# Patient Record
Sex: Female | Born: 2000 | Hispanic: No | Marital: Single | Smoking: Never smoker
Health system: Southern US, Community
[De-identification: ages and names within clinical notes are randomized; demographics above are authoritative.]

## PROBLEM LIST (undated history)

## (undated) DIAGNOSIS — A549 Gonococcal infection, unspecified: Secondary | ICD-10-CM

## (undated) DIAGNOSIS — F32A Depression, unspecified: Secondary | ICD-10-CM

## (undated) DIAGNOSIS — F419 Anxiety disorder, unspecified: Secondary | ICD-10-CM

## (undated) HISTORY — DX: Gonococcal infection, unspecified: A54.9

## (undated) HISTORY — DX: Depression, unspecified: F32.A

## (undated) HISTORY — DX: Anxiety disorder, unspecified: F41.9

---
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
## 2019-01-13 ENCOUNTER — Ambulatory Visit (HOSPITAL_COMMUNITY)
Admission: EM | Admit: 2019-01-13 | Discharge: 2019-01-13 | Disposition: A | Discharge status: Home / Self Care | Payer: Medicaid Other | Attending: Internal Medicine | Admitting: Internal Medicine

## 2019-01-13 ENCOUNTER — Encounter (HOSPITAL_COMMUNITY): Payer: Self-pay

## 2019-01-13 ENCOUNTER — Other Ambulatory Visit: Payer: Self-pay

## 2019-01-13 DIAGNOSIS — Z20828 Contact with and (suspected) exposure to other viral communicable diseases: Secondary | ICD-10-CM | POA: Insufficient documentation

## 2019-01-13 DIAGNOSIS — J020 Streptococcal pharyngitis: Secondary | ICD-10-CM

## 2019-01-13 DIAGNOSIS — R112 Nausea with vomiting, unspecified: Secondary | ICD-10-CM | POA: Insufficient documentation

## 2019-01-13 DIAGNOSIS — Z79899 Other long term (current) drug therapy: Secondary | ICD-10-CM | POA: Diagnosis not present

## 2019-01-13 DIAGNOSIS — J029 Acute pharyngitis, unspecified: Secondary | ICD-10-CM | POA: Diagnosis not present

## 2019-01-13 DIAGNOSIS — Z7722 Contact with and (suspected) exposure to environmental tobacco smoke (acute) (chronic): Secondary | ICD-10-CM | POA: Insufficient documentation

## 2019-01-13 LAB — POCT RAPID STREP A: Streptococcus, Group A Screen (Direct): POSITIVE — AB

## 2019-01-13 MED ORDER — CEPACOL EXTRA STRENGTH 15-2.6 MG MT LOZG: 1.0000 | LOZENGE | OROMUCOSAL | 0 refills | Status: DC | PRN

## 2019-01-13 MED ORDER — PENICILLIN V POTASSIUM 500 MG PO TABS: 500.0000 mg | ORAL_TABLET | Freq: Two times a day (BID) | ORAL | 0 refills | Status: AC

## 2019-01-13 NOTE — ED Provider Notes (Signed)
[facility]    CSN: [id number] Arrival date & time: 01/13/19  1558      History   Chief Complaint Chief Complaint  Patient presents with  . Nausea  . Emesis    HPI Rebecca Roy is a 18 y.o. female with a history of recurrent tonsillitis comes to urgent care with complaints of sore throat 3 days duration.  Patient says symptoms started fairly rapidly and has been persistent.  Is been associated with fever, difficulty swallowing, nausea and vomiting.  No shortness of breath, cough or sputum production.  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  No sick contacts.  No generalized body aches.  No loss of taste or smell. HPI  History reviewed. No pertinent past medical history.  Patient Active Problem List   Diagnosis Date Noted  . Contraception management 09/07/2016  . Vaginal bleeding 09/07/2016  . Induration of skin 09/07/2016  . Encounter for initial prescription of injectable contraceptive 11/22/2015  . Annual physical exam 08/31/2015    History reviewed. No pertinent surgical history.  OB History   No obstetric history on file.      Home Medications    Prior to Admission medications   Medication Sig Start Date End Date Taking? Authorizing Provider  acetaminophen (TYLENOL) 325 MG tablet Take 2 tablets (650 mg total) by mouth every 6 (six) hours as needed for mild pain, moderate pain or fever. 03/21/17   Jean Rosenthal, NP  cephALEXin (KEFLEX) 500 MG capsule Take 1 capsule (500 mg total) by mouth 3 (three) times daily. 07/16/18   Veryl Speak, MD  ibuprofen (ADVIL,MOTRIN) 400 MG tablet Take 1 tablet (400 mg total) by mouth every 6 (six) hours as needed. 12/07/17   Noemi Chapel, MD  norgestimate-ethinyl estradiol (ORTHO-CYCLEN,SPRINTEC,PREVIFEM) 0.25-35 MG-MCG tablet Take 1 tablet by mouth daily. 09/06/16   Rogue Bussing, MD  omeprazole (PRILOSEC) 20 MG capsule Take 1 capsule (20 mg total) by mouth daily. 06/15/17   Rancour,  Annie Main, MD  ondansetron (ZOFRAN ODT) 4 MG disintegrating tablet Take 1 tablet (4 mg total) by mouth every 8 (eight) hours as needed for nausea or vomiting. 06/15/17   Rancour, Annie Main, MD  potassium chloride (K-DUR) 10 MEQ tablet Take 1 tablet (10 mEq total) by mouth 2 (two) times daily. 06/15/17   Ezequiel Essex, MD    Family History Family History  Problem Relation Age of Onset  . Heart disease Maternal Aunt   . Diabetes Maternal Aunt   . Hypertension Maternal Aunt   . Heart disease Maternal Grandmother   . Diabetes Maternal Grandmother   . Hypertension Maternal Grandmother     Social History Social History   Tobacco Use  . Smoking status: Passive Smoke Exposure - Never Smoker  . Smokeless tobacco: Never Used  Substance Use Topics  . Alcohol use: No  . Drug use: Yes    Types: Marijuana     Allergies   Patient has no known allergies.   Review of Systems Review of Systems  Constitutional: Positive for activity change and fever. Negative for chills and fatigue.  HENT: Positive for sore throat. Negative for congestion, drooling, ear discharge, ear pain, mouth sores, postnasal drip, rhinorrhea, sinus pressure, sneezing and voice change.   Eyes: Negative for pain, redness and itching.  Respiratory: Negative.  Negative for chest tightness, shortness of breath and wheezing.   Cardiovascular: Negative for chest pain and palpitations.  Gastrointestinal: Positive for nausea and vomiting. Negative for abdominal pain, constipation  and diarrhea.  Genitourinary: Negative.  Negative for dysuria.  Musculoskeletal: Negative for arthralgias, back pain, gait problem and myalgias.  Skin: Negative.  Negative for rash.  Neurological: Negative for dizziness, weakness and headaches.  Psychiatric/Behavioral: Negative for confusion and decreased concentration.     Physical Exam Triage Vital Signs ED Triage Vitals  Enc Vitals Group     BP 01/13/19 1649 115/75     Pulse Rate 01/13/19  1649 80     Resp 01/13/19 1649 18     Temp 01/13/19 1649 98.4 F (36.9 C)     Temp Source 01/13/19 1649 Oral     SpO2 01/13/19 1649 100 %     Weight 01/13/19 1647 134 lb (60.8 kg)     Height --      Head Circumference --      Peak Flow --      Pain Score 01/13/19 1647 7     Pain Loc --      Pain Edu? --      Excl. in [place]? --    No data found.  Updated Vital Signs BP 115/75 (BP Location: Right Arm)   Pulse 80   Temp 98.4 F (36.9 C) (Oral)   Resp 18   Wt 60.8 kg   LMP 12/30/2018   SpO2 100%   BMI 23.74 kg/m   Visual Acuity Right Eye Distance:   Left Eye Distance:   Bilateral Distance:    Right Eye Near:   Left Eye Near:    Bilateral Near:     Physical Exam Vitals signs and nursing note reviewed.  Constitutional:      General: She is in acute distress.     Appearance: She is ill-appearing. She is not toxic-appearing.  HENT:     Right Ear: Tympanic membrane normal.     Left Ear: Tympanic membrane normal.     Nose: No rhinorrhea.     Mouth/Throat:     Pharynx: Posterior oropharyngeal erythema present.     Comments: Bilateral tonsillar enlargement with white patches.  Pharyngeal erythema.  Halitosis present. Neck:     Musculoskeletal: Normal range of motion. Muscular tenderness present. No neck rigidity.  Cardiovascular:     Rate and Rhythm: Normal rate and regular rhythm.     Pulses: Normal pulses.     Heart sounds: Normal heart sounds. No murmur. No friction rub.  Pulmonary:     Effort: Pulmonary effort is normal. No respiratory distress.     Breath sounds: Normal breath sounds. No wheezing or rhonchi.  Abdominal:     General: Bowel sounds are normal.     Palpations: Abdomen is soft.  Musculoskeletal: Normal range of motion.  Lymphadenopathy:     Cervical: Cervical adenopathy present.  Skin:    General: Skin is warm.     Capillary Refill: Capillary refill takes less than 2 seconds.  Neurological:     General: No focal deficit present.     Mental  Status: She is alert and oriented to person, place, and time.      UC Treatments / Results  Labs (all labs ordered are listed, but only abnormal results are displayed) Labs Reviewed  NOVEL CORONAVIRUS, NAA (HOSPITAL ORDER, SEND-OUT TO REF LAB)    EKG   Radiology No results found.  Procedures Procedures (including critical care time)  Medications Ordered in UC Medications - No data to display  Initial Impression / Assessment and Plan / UC Course  I have reviewed the triage vital  signs and the nursing notes.  Pertinent labs & imaging results that were available during my care of the patient were reviewed by me and considered in my medical decision making (see chart for details).     1.  Recurrent streptococcal pharyngitis: Rapid strep is positive Penicillin V 500 mg twice daily for 10 days Tylenol/Motrin for pain Cepacol lozenges as needed for throat pain Patient is advised to return to urgent care if symptoms worsen COVID-19 screening done Given the recurrent nature and persistent bilateral tonsillar hypertrophy in the setting of noisy breathing at night, patient will benefit from ENT evaluation for tonsillectomy.  Information on the ENT specialist has been given to the patient.  Final Clinical Impressions(s) / UC Diagnoses   Final diagnoses:  Acute pharyngitis, unspecified etiology   Discharge Instructions   None    ED Prescriptions    None     Controlled Substance Prescriptions [place] Controlled Substance Registry consulted? No   Chase Picket, MD 01/13/19 [phone]

## 2019-01-13 NOTE — ED Triage Notes (Signed)
Pt states she has had nausea, vomiting and fever. X 3 days. Pt states she has a sore throat as well.

## 2019-01-17 LAB — NOVEL CORONAVIRUS, NAA (HOSP ORDER, SEND-OUT TO REF LAB; TAT 18-24 HRS): SARS-CoV-2, NAA: NOT DETECTED

## 2019-04-21 ENCOUNTER — Emergency Department (HOSPITAL_COMMUNITY)
Admission: EM | Admit: 2019-04-21 | Discharge: 2019-04-21 | Discharge status: Against Medical Advice | Payer: Medicaid Other | Attending: Emergency Medicine | Admitting: Emergency Medicine

## 2019-04-21 ENCOUNTER — Encounter (HOSPITAL_COMMUNITY): Payer: Self-pay

## 2019-04-21 ENCOUNTER — Other Ambulatory Visit: Payer: Self-pay

## 2019-04-21 DIAGNOSIS — Z5321 Procedure and treatment not carried out due to patient leaving prior to being seen by health care provider: Secondary | ICD-10-CM | POA: Diagnosis not present

## 2019-04-21 DIAGNOSIS — J029 Acute pharyngitis, unspecified: Secondary | ICD-10-CM | POA: Diagnosis present

## 2019-04-21 NOTE — ED Triage Notes (Signed)
Patient complains of sore throat and painful swallowing x 2 days. Patient anxious on arrival, no trouble handling secretions

## 2019-04-21 NOTE — ED Notes (Signed)
Pt called for room x4 with non answer

## 2019-04-22 ENCOUNTER — Encounter (HOSPITAL_COMMUNITY): Payer: Self-pay | Admitting: Emergency Medicine

## 2019-04-22 ENCOUNTER — Other Ambulatory Visit: Payer: Self-pay

## 2019-04-22 ENCOUNTER — Emergency Department (HOSPITAL_COMMUNITY)
Admission: EM | Admit: 2019-04-22 | Discharge: 2019-04-22 | Disposition: A | Discharge status: Home / Self Care | Payer: Medicaid Other | Attending: Emergency Medicine | Admitting: Emergency Medicine

## 2019-04-22 ENCOUNTER — Emergency Department (HOSPITAL_COMMUNITY): Payer: Medicaid Other

## 2019-04-22 DIAGNOSIS — J029 Acute pharyngitis, unspecified: Secondary | ICD-10-CM | POA: Diagnosis present

## 2019-04-22 DIAGNOSIS — Z793 Long term (current) use of hormonal contraceptives: Secondary | ICD-10-CM | POA: Insufficient documentation

## 2019-04-22 DIAGNOSIS — J020 Streptococcal pharyngitis: Secondary | ICD-10-CM | POA: Diagnosis not present

## 2019-04-22 DIAGNOSIS — Z7722 Contact with and (suspected) exposure to environmental tobacco smoke (acute) (chronic): Secondary | ICD-10-CM | POA: Diagnosis not present

## 2019-04-22 DIAGNOSIS — Z79899 Other long term (current) drug therapy: Secondary | ICD-10-CM | POA: Insufficient documentation

## 2019-04-22 LAB — GROUP A STREP BY PCR: Group A Strep by PCR: DETECTED — AB

## 2019-04-22 MED ORDER — DEXAMETHASONE 10 MG/ML FOR PEDIATRIC ORAL USE
16.0000 mg | Freq: Once | INTRAMUSCULAR | Status: AC
  Administered 2019-04-22: 16 mg via ORAL
  Filled 2019-04-22: qty 2

## 2019-04-22 MED ORDER — LIDOCAINE HCL (PF) 1 % IJ SOLN
INTRAMUSCULAR | Status: AC
  Filled 2019-04-22: qty 2

## 2019-04-22 MED ORDER — LIDOCAINE VISCOUS HCL 2 % MT SOLN
15.0000 mL | Freq: Once | OROMUCOSAL | Status: AC
  Administered 2019-04-22: 15 mL via OROMUCOSAL
  Filled 2019-04-22: qty 15

## 2019-04-22 MED ORDER — PENICILLIN G BENZATHINE 1200000 UNIT/2ML IM SUSP
1.2000 10*6.[IU] | Freq: Once | INTRAMUSCULAR | Status: AC
  Administered 2019-04-22: 1.2 10*6.[IU] via INTRAMUSCULAR
  Filled 2019-04-22: qty 2

## 2019-04-22 MED ORDER — KETOROLAC TROMETHAMINE 60 MG/2ML IM SOLN
60.0000 mg | Freq: Once | INTRAMUSCULAR | Status: AC
  Administered 2019-04-22: 60 mg via INTRAMUSCULAR
  Filled 2019-04-22 (×2): qty 2

## 2019-04-22 NOTE — ED Triage Notes (Signed)
Pt c/o sore throat and painful swallowing x 3 days.

## 2019-04-22 NOTE — ED Provider Notes (Signed)
[facility] EMERGENCY DEPARTMENT Provider Note   CSN: [id number] Arrival date & time: 04/22/19  0304     History   Chief Complaint Chief Complaint  Patient presents with  . Sore Throat    HPI Rebecca Roy is a 18 y.o. female.      Sore Throat This is a new problem. The current episode started more than 2 days ago. The problem occurs constantly. The problem has been gradually worsening. Pertinent negatives include no chest pain, no headaches and no shortness of breath. Nothing aggravates the symptoms. Nothing relieves the symptoms. She has tried nothing for the symptoms.    History reviewed. No pertinent past medical history.  Patient Active Problem List   Diagnosis Date Noted  . Contraception management 09/07/2016  . Vaginal bleeding 09/07/2016  . Induration of skin 09/07/2016  . Encounter for initial prescription of injectable contraceptive 11/22/2015  . Annual physical exam 08/31/2015    History reviewed. No pertinent surgical history.   OB History   No obstetric history on file.      Home Medications    Prior to Admission medications   Medication Sig Start Date End Date Taking? Authorizing Provider  acetaminophen (TYLENOL) 325 MG tablet Take 2 tablets (650 mg total) by mouth every 6 (six) hours as needed for mild pain, moderate pain or fever. 03/21/17   Scoville, Kennis Carina, NP  Benzocaine-Menthol (CEPACOL EXTRA STRENGTH) 15-2.6 MG LOZG Use as directed 1 lozenge in the mouth or throat as needed. 01/13/19   Lamptey, Myrene Galas, MD  ibuprofen (ADVIL,MOTRIN) 400 MG tablet Take 1 tablet (400 mg total) by mouth every 6 (six) hours as needed. 12/07/17   Noemi Chapel, MD  norgestimate-ethinyl estradiol (ORTHO-CYCLEN,SPRINTEC,PREVIFEM) 0.25-35 MG-MCG tablet Take 1 tablet by mouth daily. 09/06/16   Rogue Bussing, MD  omeprazole (PRILOSEC) 20 MG capsule Take 1 capsule (20 mg total) by mouth daily. 06/15/17   Rancour, Annie Main, MD  ondansetron (ZOFRAN ODT) 4 MG  disintegrating tablet Take 1 tablet (4 mg total) by mouth every 8 (eight) hours as needed for nausea or vomiting. 06/15/17   Rancour, Annie Main, MD  potassium chloride (K-DUR) 10 MEQ tablet Take 1 tablet (10 mEq total) by mouth 2 (two) times daily. 06/15/17   Ezequiel Essex, MD    Family History Family History  Problem Relation Age of Onset  . Heart disease Maternal Aunt   . Diabetes Maternal Aunt   . Hypertension Maternal Aunt   . Heart disease Maternal Grandmother   . Diabetes Maternal Grandmother   . Hypertension Maternal Grandmother     Social History Social History   Tobacco Use  . Smoking status: Passive Smoke Exposure - Never Smoker  . Smokeless tobacco: Never Used  Substance Use Topics  . Alcohol use: No  . Drug use: Yes    Types: Marijuana     Allergies   Patient has no known allergies.   Review of Systems Review of Systems  Respiratory: Negative for shortness of breath.   Cardiovascular: Negative for chest pain.  Neurological: Negative for headaches.  All other systems reviewed and are negative.    Physical Exam Updated Vital Signs BP 124/71   Pulse 94   Temp 100 F (37.8 C)   Resp 18   Ht 5\' 3"  (1.6 m)   Wt 60.8 kg   LMP 04/17/2019   SpO2 99%   BMI 23.74 kg/m   Physical Exam Vitals signs and nursing note reviewed.  Constitutional:  Appearance: She is well-developed.  HENT:     Head: Normocephalic and atraumatic.     Mouth/Throat:     Pharynx: Oropharyngeal exudate and posterior oropharyngeal erythema present.  Eyes:     Pupils: Pupils are equal, round, and reactive to light.  Neck:     Musculoskeletal: Normal range of motion.  Cardiovascular:     Rate and Rhythm: Normal rate and regular rhythm.  Pulmonary:     Effort: No respiratory distress.     Breath sounds: No stridor.  Abdominal:     General: There is no distension.  Musculoskeletal: Normal range of motion.        General: No swelling or tenderness.  Skin:    General:  Skin is warm and dry.  Neurological:     General: No focal deficit present.     Mental Status: She is alert.      ED Treatments / Results  Labs (all labs ordered are listed, but only abnormal results are displayed) Labs Reviewed  GROUP A STREP BY PCR - Abnormal; Notable for the following components:      Result Value   Group A Strep by PCR DETECTED (*)    All other components within normal limits    EKG None  Radiology Dg Neck Soft Tissue  Result Date: 04/22/2019 CLINICAL DATA:  Sore throat for 3 days. EXAM: NECK SOFT TISSUES - 1+ VIEW COMPARISON:  None. FINDINGS: Palatine tonsillar thickening. No retropharyngeal thickening or gas. Normal epiglottis laryngeal shadows. IMPRESSION: 1. Tonsillar thickening. 2. Normal appearance of the epiglottis and retropharyngeal soft tissues. Electronically Signed   By: Monte Fantasia M.D.   On: 04/22/2019 04:14    Procedures Procedures (including critical care time)  Medications Ordered in ED Medications  ketorolac (TORADOL) injection 60 mg (60 mg Intramuscular Given 04/22/19 0411)  penicillin g benzathine (BICILLIN LA) 1200000 UNIT/2ML injection 1.2 Million Units (1.2 Million Units Intramuscular Given 04/22/19 0411)  dexamethasone (DECADRON) 10 MG/ML injection for Pediatric ORAL use 16 mg (16 mg Oral Given 04/22/19 0412)  lidocaine (XYLOCAINE) 2 % viscous mouth solution 15 mL (15 mLs Mouth/Throat Given 04/22/19 0411)     Initial Impression / Assessment and Plan / ED Course  I have reviewed the triage vital signs and the nursing notes.  Pertinent labs & imaging results that were available during my care of the patient were reviewed by me and considered in my medical decision making (see chart for details).        Uncomplicated strep pharyngitis. No e/o PTA or RPA. Swallowing secretions. No e/o deep neck space infection.  Final Clinical Impressions(s) / ED Diagnoses   Final diagnoses:  Pharyngitis, unspecified etiology    ED  Discharge Orders    None       Burnetta Kohls, Corene Cornea, MD 04/22/19 [phone]

## 2020-05-27 ENCOUNTER — Other Ambulatory Visit: Payer: Self-pay

## 2020-05-27 ENCOUNTER — Emergency Department (HOSPITAL_COMMUNITY)
Admission: EM | Admit: 2020-05-27 | Discharge: 2020-05-27 | Disposition: A | Discharge status: Home / Self Care | Payer: Medicaid Other | Attending: Emergency Medicine | Admitting: Emergency Medicine

## 2020-05-27 ENCOUNTER — Encounter (HOSPITAL_COMMUNITY): Payer: Self-pay | Admitting: *Deleted

## 2020-05-27 DIAGNOSIS — Z7722 Contact with and (suspected) exposure to environmental tobacco smoke (acute) (chronic): Secondary | ICD-10-CM | POA: Insufficient documentation

## 2020-05-27 DIAGNOSIS — B950 Streptococcus, group A, as the cause of diseases classified elsewhere: Secondary | ICD-10-CM

## 2020-05-27 DIAGNOSIS — J029 Acute pharyngitis, unspecified: Secondary | ICD-10-CM | POA: Diagnosis present

## 2020-05-27 DIAGNOSIS — J020 Streptococcal pharyngitis: Secondary | ICD-10-CM | POA: Insufficient documentation

## 2020-05-27 LAB — I-STAT BETA HCG BLOOD, ED (MC, WL, AP ONLY): I-stat hCG, quantitative: <5 m[IU]/mL (ref <5)

## 2020-05-27 LAB — GROUP A STREP BY PCR: Group A Strep by PCR: DETECTED — AB

## 2020-05-27 MED ORDER — LIDOCAINE VISCOUS HCL 2 % MT SOLN
15.0000 mL | Freq: Once | OROMUCOSAL | Status: AC
  Administered 2020-05-27: 15 mL via OROMUCOSAL
  Filled 2020-05-27: qty 15

## 2020-05-27 MED ORDER — KETOROLAC TROMETHAMINE 30 MG/ML IJ SOLN
30.0000 mg | Freq: Once | INTRAMUSCULAR | Status: AC
  Administered 2020-05-27: 30 mg via INTRAMUSCULAR
  Filled 2020-05-27: qty 1

## 2020-05-27 MED ORDER — AMOXICILLIN 500 MG PO CAPS: 500.0000 mg | ORAL_CAPSULE | Freq: Two times a day (BID) | ORAL | 0 refills | Status: AC

## 2020-05-27 MED ORDER — NAPROXEN 500 MG PO TABS: 500.0000 mg | ORAL_TABLET | Freq: Two times a day (BID) | ORAL | 0 refills | Status: DC

## 2020-05-27 MED ORDER — DEXAMETHASONE SODIUM PHOSPHATE 10 MG/ML IJ SOLN
10.0000 mg | Freq: Once | INTRAMUSCULAR | Status: AC
  Administered 2020-05-27: 10 mg via INTRAMUSCULAR
  Filled 2020-05-27: qty 1

## 2020-05-27 NOTE — ED Notes (Signed)
Patient called for room placement x1 with no answer. 

## 2020-05-27 NOTE — Discharge Instructions (Signed)
As discussed, your strep test was positive.  I am sending you home with an antibiotic.  Take as prescribed and finish all antibiotics.  Also sending home with some pain medication.  Take as needed for pain.  Please follow-up with PCP if symptoms do not improve within the next week.  Return to the ER for new or worsening symptoms.

## 2020-05-27 NOTE — ED Triage Notes (Signed)
Pt states she has swelling on the left side of throat. Has a history of tonsillitis. States it feels like it again, 3 rd time in last year, concerned if they need to be removed.

## 2020-05-27 NOTE — ED Provider Notes (Signed)
[facility] DEPT Provider Note   CSN: [id number] Arrival date & time: 05/27/20  1141     History No chief complaint on file.   Rebecca Roy is a 19 y.o. female with no significant past medical history who presents to the ED due to sore throat x2 days associated with left-sided edema, voice changes, and pain with swallowing.  Patient notes she had a fever last night.  T-max 102 F.  Sore throat associated with headache.  Patient denies nausea, vomiting, abdominal pain, difficulties breathing, cough.  Patient denies sick contacts and known Covid exposures.  Patient denies any recent oral intercourse.  No difficulty swallowing however, patient notes it is extremely painful.  No treatment prior to arrival.  History obtained from patient and past medical records. No interpreter used during encounter.      History reviewed. No pertinent past medical history.  Patient Active Problem List   Diagnosis Date Noted  . Contraception management 09/07/2016  . Vaginal bleeding 09/07/2016  . Induration of skin 09/07/2016  . Encounter for initial prescription of injectable contraceptive 11/22/2015  . Annual physical exam 08/31/2015    History reviewed. No pertinent surgical history.   OB History   No obstetric history on file.     Family History  Problem Relation Age of Onset  . Heart disease Maternal Aunt   . Diabetes Maternal Aunt   . Hypertension Maternal Aunt   . Heart disease Maternal Grandmother   . Diabetes Maternal Grandmother   . Hypertension Maternal Grandmother     Social History   Tobacco Use  . Smoking status: Passive Smoke Exposure - Never Smoker  . Smokeless tobacco: Never Used  Vaping Use  . Vaping Use: Never used  Substance Use Topics  . Alcohol use: No  . Drug use: Yes    Types: Marijuana    Home Medications Prior to Admission medications   Medication Sig Start Date End Date Taking? Authorizing Provider  acetaminophen  (TYLENOL) 325 MG tablet Take 2 tablets (650 mg total) by mouth every 6 (six) hours as needed for mild pain, moderate pain or fever. 03/21/17   Jean Rosenthal, NP  amoxicillin (AMOXIL) 500 MG capsule Take 1 capsule (500 mg total) by mouth 2 (two) times daily for 10 days. 05/27/20 06/06/20  Suzy Bouchard, PA-C  Benzocaine-Menthol (CEPACOL EXTRA STRENGTH) 15-2.6 MG LOZG Use as directed 1 lozenge in the mouth or throat as needed. 01/13/19   Lamptey, Myrene Galas, MD  ibuprofen (ADVIL,MOTRIN) 400 MG tablet Take 1 tablet (400 mg total) by mouth every 6 (six) hours as needed. 12/07/17   Noemi Chapel, MD  naproxen (NAPROSYN) 500 MG tablet Take 1 tablet (500 mg total) by mouth 2 (two) times daily. 05/27/20   Suzy Bouchard, PA-C  norgestimate-ethinyl estradiol (ORTHO-CYCLEN,SPRINTEC,PREVIFEM) 0.25-35 MG-MCG tablet Take 1 tablet by mouth daily. 09/06/16   Rogue Bussing, MD  omeprazole (PRILOSEC) 20 MG capsule Take 1 capsule (20 mg total) by mouth daily. 06/15/17   Rancour, Annie Main, MD  ondansetron (ZOFRAN ODT) 4 MG disintegrating tablet Take 1 tablet (4 mg total) by mouth every 8 (eight) hours as needed for nausea or vomiting. 06/15/17   Rancour, Annie Main, MD  potassium chloride (K-DUR) 10 MEQ tablet Take 1 tablet (10 mEq total) by mouth 2 (two) times daily. 06/15/17   Ezequiel Essex, MD    Allergies    Patient has no known allergies.  Review of Systems   Review of Systems  Constitutional: Positive  for chills and fever.  HENT: Positive for sore throat and voice change. Negative for ear pain and facial swelling.   Respiratory: Negative for cough and shortness of breath.   Cardiovascular: Negative for chest pain.    Physical Exam Updated Vital Signs BP 122/83 (BP Location: Right Arm)   Pulse 98   Temp 98.4 F (36.9 C) (Oral)   Resp 16   Ht 5\' 3"  (1.6 m)   SpO2 98%   BMI 23.74 kg/m   Physical Exam Vitals and nursing note reviewed.  Constitutional:      General: She is  not in acute distress.    Appearance: She is not toxic-appearing.  HENT:     Head: Normocephalic.     Mouth/Throat:     Comments: Significant erythema with 3+ tonsillar hypertrophy on left. No trismus. Uvula slightly deviated. Exudates present.  Tolerating oral secretions without difficulty.  Slightly muffled voice. Eyes:     Pupils: Pupils are equal, round, and reactive to light.  Neck:     Comments: No meningismus. Cardiovascular:     Rate and Rhythm: Normal rate and regular rhythm.     Pulses: Normal pulses.     Heart sounds: Normal heart sounds. No murmur heard. No friction rub. No gallop.   Pulmonary:     Effort: Pulmonary effort is normal.     Breath sounds: Normal breath sounds.  Abdominal:     General: Abdomen is flat. There is no distension.     Palpations: Abdomen is soft.     Tenderness: There is no abdominal tenderness. There is no guarding or rebound.  Musculoskeletal:     Cervical back: Neck supple.     Comments: Able to move all 4 extremities without difficulty.  Skin:    General: Skin is warm and dry.  Neurological:     General: No focal deficit present.     Mental Status: She is alert.  Psychiatric:        Mood and Affect: Mood normal.        Behavior: Behavior normal.     ED Results / Procedures / Treatments   Labs (all labs ordered are listed, but only abnormal results are displayed) Labs Reviewed  GROUP A STREP BY PCR - Abnormal; Notable for the following components:      Result Value   Group A Strep by PCR DETECTED (*)    All other components within normal limits  I-STAT BETA HCG BLOOD, ED ([facility], [facility], [facility] ONLY)    EKG None  Radiology No results found.  Procedures Procedures (including critical care time)  Medications Ordered in ED Medications  dexamethasone (DECADRON) injection 10 mg (10 mg Intramuscular Given 05/27/20 1404)  ketorolac (TORADOL) 30 MG/ML injection 30 mg (30 mg Intramuscular Given 05/27/20 1404)  lidocaine (XYLOCAINE) 2 %  viscous mouth solution 15 mL (15 mLs Mouth/Throat Given 05/27/20 1404)    ED Course  I have reviewed the triage vital signs and the nursing notes.  Pertinent labs & imaging results that were available during my care of the patient were reviewed by me and considered in my medical decision making (see chart for details).  Clinical Course as of 05/27/20 1531  Sat May 27, 2020  1252 I-stat hCG, quantitative: <5.0 [CA]  1524 Group A Strep by PCR(!): DETECTED [CA]    Clinical Course User Index [CA] Suzy Bouchard, PA-C   MDM Rules/Calculators/A&P  19 year old female presents to the ED due to sore throat x2 days associated with changes to phonation.  Upon arrival, vitals all within normal limits.  Low suspicion for sepsis at this time.  Patient in no acute distress and nontoxic-appearing.  Physical exam significant for significant erythema with 3+ tonsillar hypertrophy on left. No trismus. Uvula slightly deviated. Exudates present.  Tolerating oral secretions without difficulty.  Slightly muffled voice. No obvious abscess.  No meningismus to suggest meningitis.  Will obtain strep test and pregnancy test prior to giving medications.  Low suspicion for gonorrhea infection given no oral intercourse recently. Suspect symptoms related to tonsillitis. Low suspicion for abscess after discussion with Dr. Eulis Foster. Discussed case with Dr. Eulis Foster who evaluated patient at bedside and agrees with assessment and plan. Patient given Toradol, Decadron, and viscous lidocaine here in the ED for symptomatic relief.   Strep test positive.  Will discharge patient with antibiotics and pain medication.  Instructed patient to follow-up with PCP if symptoms not improved within the next week. Strict ED precautions discussed with patient. Patient states understanding and agrees to plan. Patient discharged home in no acute distress and stable vitals. Final Clinical Impression(s) / ED Diagnoses Final  diagnoses:  Group A streptococcal infection    Rx / DC Orders ED Discharge Orders         Ordered    amoxicillin (AMOXIL) 500 MG capsule  2 times daily        05/27/20 1416    naproxen (NAPROSYN) 500 MG tablet  2 times daily        05/27/20 1525           Karie Kirks 05/27/20 1531    Daleen Bo, MD 05/27/20 2020

## 2020-05-27 NOTE — ED Notes (Signed)
Patient has a extra lav and light green in main lab

## 2020-05-27 NOTE — ED Provider Notes (Signed)
  Face-to-face evaluation   History: She presents for evaluation of sore throat for 2 days, left greater than right.  She is concerned about recurrent tonsillitis.  She states she had trouble sleeping last night because it was difficult to breathe.  Physical exam: Alert, calm, cooperative.  No stridor.  No trismus.  Moderate tonsillar hypertrophy with exudate, left greater than right.  No evidence for peritonsillar abscess.  When the patient opens her mouth she moves her head to the right, which sometimes makes patients appear to have a peritonsillar abscess.  In this case I do not believe that she has a peritonsillar abscess.  Medical screening examination/treatment/procedure(s) were conducted as a shared visit with non-physician practitioner(s) and myself.  I personally evaluated the patient during the encounter   Daleen Bo, MD 05/27/20 2020

## 2020-11-23 ENCOUNTER — Encounter (HOSPITAL_COMMUNITY): Payer: Self-pay

## 2020-11-23 ENCOUNTER — Emergency Department (HOSPITAL_COMMUNITY): Payer: Medicaid Other

## 2020-11-23 ENCOUNTER — Emergency Department (HOSPITAL_COMMUNITY)
Admission: EM | Admit: 2020-11-23 | Discharge: 2020-11-23 | Disposition: A | Discharge status: Home / Self Care | Payer: Medicaid Other | Attending: Emergency Medicine | Admitting: Emergency Medicine

## 2020-11-23 ENCOUNTER — Other Ambulatory Visit: Payer: Self-pay

## 2020-11-23 DIAGNOSIS — Z5321 Procedure and treatment not carried out due to patient leaving prior to being seen by health care provider: Secondary | ICD-10-CM | POA: Insufficient documentation

## 2020-11-23 DIAGNOSIS — N6310 Unspecified lump in the right breast, unspecified quadrant: Secondary | ICD-10-CM | POA: Diagnosis not present

## 2020-11-23 DIAGNOSIS — R1084 Generalized abdominal pain: Secondary | ICD-10-CM | POA: Diagnosis not present

## 2020-11-23 LAB — I-STAT BETA HCG BLOOD, ED (MC, WL, AP ONLY): I-stat hCG, quantitative: <5 m[IU]/mL (ref <5)

## 2020-11-23 LAB — CBC WITH DIFFERENTIAL/PLATELET
Abs Immature Granulocytes: 0.01 10*3/uL (ref 0.00–0.07)
Basophils Absolute: 0 10*3/uL (ref 0.0–0.1)
Basophils Relative: 0 %
Eosinophils Absolute: 0.3 10*3/uL (ref 0.0–0.5)
Eosinophils Relative: 4 %
HCT: 35.5 % — ABNORMAL LOW (ref 36.0–46.0)
Hemoglobin: 11.2 g/dL — ABNORMAL LOW (ref 12.0–15.0)
Immature Granulocytes: 0 %
Lymphocytes Relative: 25 %
Lymphs Abs: 2.1 10*3/uL (ref 0.7–4.0)
MCH: 28.6 pg (ref 26.0–34.0)
MCHC: 31.5 g/dL (ref 30.0–36.0)
MCV: 90.8 fL (ref 80.0–100.0)
Monocytes Absolute: 1 10*3/uL (ref 0.1–1.0)
Monocytes Relative: 11 %
Neutro Abs: 5 10*3/uL (ref 1.7–7.7)
Neutrophils Relative %: 60 %
Platelets: 319 10*3/uL (ref 150–400)
RBC: 3.91 MIL/uL (ref 3.87–5.11)
RDW: 16 % — ABNORMAL HIGH (ref 11.5–15.5)
WBC: 8.4 10*3/uL (ref 4.0–10.5)
nRBC: 0 % (ref 0.0–0.2)

## 2020-11-23 LAB — COMPREHENSIVE METABOLIC PANEL
ALT: 11 U/L (ref 0–44)
AST: 14 U/L — ABNORMAL LOW (ref 15–41)
Albumin: 4.1 g/dL (ref 3.5–5.0)
Alkaline Phosphatase: 56 U/L (ref 38–126)
Anion gap: 7 (ref 5–15)
BUN: 13 mg/dL (ref 6–20)
CO2: 24 mmol/L (ref 22–32)
Calcium: 8.9 mg/dL (ref 8.9–10.3)
Chloride: 107 mmol/L (ref 98–111)
Creatinine, Ser: 0.8 mg/dL (ref 0.44–1.00)
GFR, Estimated: >60 mL/min (ref >60)
Glucose, Bld: 90 mg/dL (ref 70–99)
Potassium: 3.8 mmol/L (ref 3.5–5.1)
Sodium: 138 mmol/L (ref 135–145)
Total Bilirubin: 0.8 mg/dL (ref 0.3–1.2)
Total Protein: 7.2 g/dL (ref 6.5–8.1)

## 2020-11-23 LAB — URINALYSIS, ROUTINE W REFLEX MICROSCOPIC
Bilirubin Urine: NEGATIVE
Glucose, UA: NEGATIVE mg/dL
Hgb urine dipstick: NEGATIVE
Ketones, ur: 5 mg/dL — AB
Leukocytes,Ua: NEGATIVE
Nitrite: NEGATIVE
Protein, ur: NEGATIVE mg/dL
Specific Gravity, Urine: 1.027 (ref 1.005–1.030)
pH: 5 (ref 5.0–8.0)

## 2020-11-23 LAB — LIPASE, BLOOD: Lipase: 33 U/L (ref 11–51)

## 2020-11-23 NOTE — ED Provider Notes (Signed)
Emergency Medicine Provider Triage Evaluation Note  Rebecca Roy , a 20 y.o. female  was evaluated in triage.  Pt complains of abdominal pain.  She has four days of sharp lower abdominal pain.  Has nausea, constipation.  Lmp one week ago.  Also has sharp right sided chest pain intermittently for one year.  Has lump to right breast - two years ago.  Review of Systems  Positive: Nausea, abdominal pain, constipation Negative: Vomiting, fever, dysuria, vaginal discharge.   Physical Exam  BP 113/60 (BP Location: Left Arm)   Pulse 70   Temp (!) 97.5 F (36.4 C) (Oral)   Resp 16   Ht 5\' 3"  (1.6 m)   Wt 62.6 kg   LMP 10/15/2020 (Approximate)   SpO2 100%   BMI 24.45 kg/m  Gen:   Awake, no distress   Resp:  Normal effort  MSK:   Moves extremities without difficulty    Medical Decision Making  Medically screening exam initiated at 3:46 PM.  Appropriate orders placed.  Collyns Mcquigg was informed that the remainder of the evaluation will be completed by another provider, this initial triage assessment does not replace that evaluation, and the importance of remaining in the ED until their evaluation is complete.     Quintella Reichert, MD 11/23/20 [phone]

## 2020-11-23 NOTE — ED Triage Notes (Signed)
Patient c/o lower abdominal pain x 4 days. Patient denies vaginal discharge, but states she had spotting 3 days ago. Patient states she took a laxative 2 days ago to have a BM and has not had one since.  Patient denies any dysuria or urinary frequency  Patient also reports that she has lump on her right breast.

## 2020-11-23 NOTE — ED Notes (Signed)
Pt not in lobby.  

## 2020-11-24 ENCOUNTER — Emergency Department (HOSPITAL_COMMUNITY)
Admission: EM | Admit: 2020-11-24 | Discharge: 2020-11-24 | Disposition: A | Discharge status: Home / Self Care | Payer: Medicaid Other | Attending: Emergency Medicine | Admitting: Emergency Medicine

## 2020-11-24 ENCOUNTER — Emergency Department (HOSPITAL_COMMUNITY): Payer: Medicaid Other

## 2020-11-24 ENCOUNTER — Other Ambulatory Visit: Payer: Self-pay

## 2020-11-24 ENCOUNTER — Encounter (HOSPITAL_COMMUNITY): Payer: Self-pay

## 2020-11-24 DIAGNOSIS — K5900 Constipation, unspecified: Secondary | ICD-10-CM | POA: Insufficient documentation

## 2020-11-24 DIAGNOSIS — M545 Low back pain, unspecified: Secondary | ICD-10-CM | POA: Diagnosis not present

## 2020-11-24 DIAGNOSIS — N730 Acute parametritis and pelvic cellulitis: Secondary | ICD-10-CM | POA: Insufficient documentation

## 2020-11-24 DIAGNOSIS — R1030 Lower abdominal pain, unspecified: Secondary | ICD-10-CM | POA: Diagnosis present

## 2020-11-24 DIAGNOSIS — R110 Nausea: Secondary | ICD-10-CM | POA: Diagnosis not present

## 2020-11-24 LAB — WET PREP, GENITAL
Sperm: NONE SEEN
Trich, Wet Prep: NONE SEEN
Yeast Wet Prep HPF POC: NONE SEEN

## 2020-11-24 MED ORDER — FENTANYL CITRATE (PF) 100 MCG/2ML IJ SOLN: 50.0000 ug | Freq: Once | INTRAMUSCULAR | Status: DC

## 2020-11-24 MED ORDER — CEFTRIAXONE SODIUM 500 MG IJ SOLR
500.0000 mg | Freq: Once | INTRAMUSCULAR | Status: AC
  Administered 2020-11-24: 500 mg via INTRAMUSCULAR
  Filled 2020-11-24: qty 500

## 2020-11-24 MED ORDER — ONDANSETRON 4 MG PO TBDP
4.0000 mg | ORAL_TABLET | Freq: Once | ORAL | Status: AC
  Administered 2020-11-24: 4 mg via ORAL
  Filled 2020-11-24: qty 1

## 2020-11-24 MED ORDER — DOXYCYCLINE HYCLATE 100 MG PO CAPS: 100.0000 mg | ORAL_CAPSULE | Freq: Two times a day (BID) | ORAL | 0 refills | Status: DC

## 2020-11-24 MED ORDER — ONDANSETRON HCL 4 MG/2ML IJ SOLN: 4.0000 mg | Freq: Once | INTRAMUSCULAR | Status: DC

## 2020-11-24 MED ORDER — METRONIDAZOLE 500 MG PO TABS
500.0000 mg | ORAL_TABLET | Freq: Two times a day (BID) | ORAL | 0 refills | Status: DC
Start: 2020-11-24 — End: 2020-11-24

## 2020-11-24 MED ORDER — FENTANYL CITRATE (PF) 100 MCG/2ML IJ SOLN
50.0000 ug | Freq: Once | INTRAMUSCULAR | Status: AC
  Administered 2020-11-24: 50 ug via INTRAMUSCULAR
  Filled 2020-11-24: qty 2

## 2020-11-24 MED ORDER — METRONIDAZOLE 500 MG PO TABS: 500.0000 mg | ORAL_TABLET | Freq: Two times a day (BID) | ORAL | 0 refills | Status: DC

## 2020-11-24 MED ORDER — DOXYCYCLINE HYCLATE 100 MG PO TABS
100.0000 mg | ORAL_TABLET | Freq: Once | ORAL | Status: AC
  Administered 2020-11-24: 100 mg via ORAL
  Filled 2020-11-24: qty 1

## 2020-11-24 NOTE — ED Provider Notes (Signed)
7:30 AM-checkout from Dr. Wyvonnia Dusky to evaluate patient after ultrasound imaging.  He has started treatment for PID.  8:35 AM-update.  Wet prep with clue cells and white cells.  At this time the patient states that her pain is better.  Her abdomen is soft.  She has mild tenderness in the right lower abdomen/pelvic region.  There is no peritoneal signs, and no rebound tenderness.  8:45 AM-discussed with on-call gynecologist, Dr. Elonda Husky.  We discussed the ultrasound results which he states does not indicate TOA.  He recommends 14 days of doxycycline and follow-up in his office posttreatment for reevaluation and possible further imaging.  8:50 PM-findings and follow-up discussed with patient, she is agreeable.  Plan GYN follow-up in 2 weeks.     Daleen Bo, MD 11/24/20 [phone]

## 2020-11-24 NOTE — Discharge Instructions (Addendum)
You are being treated for pelvic inflammatory disease.  Take the antibiotics as prescribed until they are finished.  Your sexual partner should be treated as well.  Follow-up with the gynecologist.  Return to the ED with worsening pain, fever, vomiting, or other concerns.  Use Tylenol or Motrin for pain.

## 2020-11-24 NOTE — ED Triage Notes (Signed)
Patient c/o lower abdominal pain x 4 days. Patient denies vaginal discharge, but states she had spotting 3 days ago after having her normal period.  Patient states she took a laxative 2 days ago to have a BM and has not had one since.   Patient denies any dysuria or urinary frequency   Patient also reports that she has lump on her right breast.

## 2020-11-24 NOTE — ED Notes (Signed)
Pt VS updated and stable, medicated per MAR. Awaiting radiology. Bed locked and low. Call bell within reach. Will continue to monitor.

## 2020-11-24 NOTE — ED Provider Notes (Signed)
[facility] EMERGENCY DEPARTMENT Provider Note   CSN: [id number] Arrival date & time: 11/24/20  0335     History Chief Complaint  Patient presents with   Abdominal Pain    Rebecca Roy is a 20 y.o. female.  Patient presents with lower abdominal pain constant for the past 1 week.  She describes severe pain in the center of her abdomen at her spreads diffusely to her lower abdomen and low back.  Pain is constant, nothing makes it better nothing makes it worse.  Feels she is constipated but had a bowel movement 2 days ago after taking a laxative.  Nausea but no vomiting.  No pain with urination or blood in the urine.  No vaginal bleeding or discharge. Still has appendix and gallbladder.  She has never had this pain in the past.  Normally has a bowel movement every day but has been constipated over the past 1 week. No chest pain or shortness of breath.  No abdominal pain, no fever.  Has had a poor appetite as well.  No sick contacts or travel. She was seen at [facility] earlier but left after triage. hCG test was negative there  The history is provided by the patient.  Abdominal Pain Associated symptoms: constipation, fever and nausea   Associated symptoms: no cough, no dysuria, no hematuria, no shortness of breath, no vaginal bleeding, no vaginal discharge and no vomiting       No past medical history on file.  Patient Active Problem List   Diagnosis Date Noted   Contraception management 09/07/2016   Vaginal bleeding 09/07/2016   Induration of skin 09/07/2016   Encounter for initial prescription of injectable contraceptive 11/22/2015   Annual physical exam 08/31/2015    No past surgical history on file.   OB History   No obstetric history on file.     Family History  Problem Relation Age of Onset   Heart disease Maternal Grandmother    Diabetes Maternal Grandmother    Hypertension Maternal Grandmother    Heart disease Maternal Aunt    Diabetes Maternal Aunt     Hypertension Maternal Aunt     Social History   Tobacco Use   Smoking status: Never    Passive exposure: Yes   Smokeless tobacco: Never  Vaping Use   Vaping Use: Every day   Substances: Nicotine, Flavoring  Substance Use Topics   Alcohol use: No   Drug use: Yes    Types: Marijuana    Home Medications Prior to Admission medications   Medication Sig Start Date End Date Taking? Authorizing Provider  acetaminophen (TYLENOL) 325 MG tablet Take 2 tablets (650 mg total) by mouth every 6 (six) hours as needed for mild pain, moderate pain or fever. 03/21/17   Scoville, Kennis Carina, NP  Benzocaine-Menthol (CEPACOL EXTRA STRENGTH) 15-2.6 MG LOZG Use as directed 1 lozenge in the mouth or throat as needed. 01/13/19   Lamptey, Myrene Galas, MD  ibuprofen (ADVIL,MOTRIN) 400 MG tablet Take 1 tablet (400 mg total) by mouth every 6 (six) hours as needed. 12/07/17   Noemi Chapel, MD  naproxen (NAPROSYN) 500 MG tablet Take 1 tablet (500 mg total) by mouth 2 (two) times daily. 05/27/20   Suzy Bouchard, PA-C  norgestimate-ethinyl estradiol (ORTHO-CYCLEN,SPRINTEC,PREVIFEM) 0.25-35 MG-MCG tablet Take 1 tablet by mouth daily. 09/06/16   Rogue Bussing, MD  omeprazole (PRILOSEC) 20 MG capsule Take 1 capsule (20 mg total) by mouth daily. 06/15/17   Ezequiel Essex, MD  ondansetron (  ZOFRAN ODT) 4 MG disintegrating tablet Take 1 tablet (4 mg total) by mouth every 8 (eight) hours as needed for nausea or vomiting. 06/15/17   Hilario Robarts, Annie Main, MD  potassium chloride (K-DUR) 10 MEQ tablet Take 1 tablet (10 mEq total) by mouth 2 (two) times daily. 06/15/17   Ezequiel Essex, MD    Allergies    Patient has no known allergies.  Review of Systems   Review of Systems  Constitutional:  Positive for activity change, appetite change and fever.  HENT:  Negative for congestion and rhinorrhea.   Respiratory:  Negative for cough, chest tightness and shortness of breath.   Gastrointestinal:  Positive for  abdominal pain, constipation and nausea. Negative for vomiting.  Genitourinary:  Positive for flank pain. Negative for dysuria, hematuria, vaginal bleeding and vaginal discharge.  Musculoskeletal:  Positive for back pain. Negative for arthralgias and myalgias.  Neurological:  Negative for dizziness, weakness and headaches.   all other systems are negative except as noted in the HPI and PMH.   Physical Exam Updated Vital Signs BP 118/85 (BP Location: Left Arm)   Pulse 85   Temp 98.2 F (36.8 C) (Oral)   Resp 18   Ht 5\' 3"  (1.6 m)   Wt 62.6 kg   LMP 11/17/2020 (Approximate)   SpO2 100%   BMI 24.45 kg/m   Physical Exam Vitals and nursing note reviewed.  Constitutional:      General: She is not in acute distress.    Appearance: She is well-developed.  HENT:     Head: Normocephalic and atraumatic.     Mouth/Throat:     Pharynx: No oropharyngeal exudate.  Eyes:     Conjunctiva/sclera: Conjunctivae normal.     Pupils: Pupils are equal, round, and reactive to light.  Neck:     Comments: No meningismus. Cardiovascular:     Rate and Rhythm: Normal rate and regular rhythm.     Heart sounds: Normal heart sounds. No murmur heard. Pulmonary:     Effort: Pulmonary effort is normal. No respiratory distress.     Breath sounds: Normal breath sounds.  Abdominal:     Palpations: Abdomen is soft.     Tenderness: There is abdominal tenderness. There is guarding. There is no rebound.     Comments: Diffuse lower abdominal tenderness with voluntary guarding.  Worse in the midline.  Genitourinary:    Comments: Chaperone present Dominica NT.  Normal external genitalia.  White discharge in vaginal vault protruding from cervix.  Positive CMT.  Positive midline and right adnexal Tenderness.  Minimal left adnexal tenderness Musculoskeletal:        General: No tenderness. Normal range of motion.     Cervical back: Normal range of motion and neck supple.  Skin:    General: Skin is warm.   Neurological:     Mental Status: She is alert and oriented to person, place, and time.     Cranial Nerves: No cranial nerve deficit.     Motor: No abnormal muscle tone.     Coordination: Coordination normal.     Comments:  5/5 strength throughout. CN 2-12 intact.Equal grip strength.   Psychiatric:        Behavior: Behavior normal.    ED Results / Procedures / Treatments   Labs (all labs ordered are listed, but only abnormal results are displayed) Labs Reviewed  WET PREP, GENITAL - Abnormal; Notable for the following components:      Result Value   Clue Cells Wet Prep  HPF POC PRESENT (*)    WBC, Wet Prep HPF POC MANY (*)    All other components within normal limits  GC/CHLAMYDIA PROBE AMP ([place]) NOT AT [facility]    EKG None  Radiology CT ABDOMEN PELVIS WO CONTRAST  Result Date: 11/24/2020 CLINICAL DATA:  Right lower quadrant abdominal pain. EXAM: CT ABDOMEN AND PELVIS WITHOUT CONTRAST TECHNIQUE: Multidetector CT imaging of the abdomen and pelvis was performed following the standard protocol without IV contrast. COMPARISON:  Chest x-ray 11/23/2020 FINDINGS: Lower chest: No acute abnormality. Hepatobiliary: No focal liver abnormality. No gallstones, gallbladder wall thickening, or pericholecystic fluid. No biliary dilatation. Pancreas: No focal lesion. Normal pancreatic contour. No surrounding inflammatory changes. No main pancreatic ductal dilatation. Spleen: Normal in size without focal abnormality. Adrenals/Urinary Tract: No adrenal nodule bilaterally. No nephrolithiasis, no hydronephrosis, and no contour-deforming renal mass. No ureterolithiasis or hydroureter. The urinary bladder is unremarkable. Stomach/Bowel: Stomach is within normal limits. No evidence of bowel wall thickening or dilatation. The appendix is visualized but not well evaluated on this noncontrast study. The appendix appears to be grossly normal in caliber. The lumen of the appendix is gas filled with no definite  right lower quadrant inflammatory changes (5: 29-46). Vascular/Lymphatic: No significant vascular findings are present. No enlarged abdominal or pelvic lymph nodes. Reproductive: Uterus is unremarkable. Bilateral ovaries appear prominent in size. Otherwise bilateral adnexa are unremarkable. Other: Interval development of. No intraperitoneal free gas. No organized fluid collection. Musculoskeletal: No acute or significant osseous findings. IMPRESSION: 1. Prominent size of bilateral ovaries. Recommend pelvic ultrasound for further evaluation. 2. Trace free pelvic fluid. 3. The appendix is grossly unremarkable. Electronically Signed   By: Iven Finn M.D.   On: 11/24/2020 05:48   DG Chest 2 View  Result Date: 11/23/2020 CLINICAL DATA:  Enlarging lump under the right nipple with pain. EXAM: CHEST - 2 VIEW COMPARISON:  September 07, 2018 FINDINGS: The heart size and mediastinal contours are within normal limits. Both lungs are clear. Stable, mild to moderate severity dextroscoliosis of the thoracic spine is seen. IMPRESSION: Stable exam without active cardiopulmonary disease. Electronically Signed   By: Virgina Norfolk M.D.   On: 11/23/2020 16:50    Procedures Procedures   Medications Ordered in ED Medications - No data to display  ED Course  I have reviewed the triage vital signs and the nursing notes.  Pertinent labs & imaging results that were available during my care of the patient were reviewed by me and considered in my medical decision making (see chart for details).    MDM Rules/Calculators/A&P                         1 week of lower abdominal pain.  No urinary or vaginal symptoms.  Labs done at [facility] were normal earlier and showed negative hCG, negative urinalysis  Labs reviewed from [facility] which are normal.  Her hCG is negative and her urinalysis is negative.  CT scan is obtained to evaluate for appendix versus ovarian pathology.  Does have cervical motion tenderness on  exam.  Will treat for PID.  Ovaries appear abnormal on CT scan will obtain ultrasound to rule out tubo-ovarian abscess. Appendix appears non dilated.  Patient to be treated for PID with IM Rocephin, p.o. Flagyl and p.o. doxycycline.  Patient instructed to have her sexual partners treated as well. Follow-up with gynecology.  Care transferred at shift change with ultrasound pending.  Dr. Eulis Foster to assume care.  Final Clinical Impression(s) / ED Diagnoses Final diagnoses:  PID (acute pelvic inflammatory disease)    Rx / DC Orders ED Discharge Orders     None        Donyae Kilner, Annie Main, MD 11/24/20 [phone]

## 2020-11-26 LAB — GC/CHLAMYDIA PROBE AMP (~~LOC~~) NOT AT ARMC
Chlamydia: NEGATIVE
Comment: NEGATIVE
Comment: NORMAL
Neisseria Gonorrhea: POSITIVE — AB

## 2020-12-19 ENCOUNTER — Other Ambulatory Visit (HOSPITAL_COMMUNITY)
Admission: RE | Admit: 2020-12-19 | Discharge: 2020-12-19 | Disposition: A | Discharge status: Home / Self Care | Payer: Medicaid Other | Source: Ambulatory Visit | Attending: Adult Health | Admitting: Adult Health

## 2020-12-19 ENCOUNTER — Encounter: Payer: Self-pay | Admitting: Adult Health

## 2020-12-19 ENCOUNTER — Ambulatory Visit (INDEPENDENT_AMBULATORY_CARE_PROVIDER_SITE_OTHER): Payer: Medicaid Other | Admitting: Adult Health

## 2020-12-19 ENCOUNTER — Other Ambulatory Visit: Payer: Self-pay

## 2020-12-19 VITALS — BP 107/66 | HR 68 | Ht 63.0 in | Wt 121.5 lb

## 2020-12-19 DIAGNOSIS — Z8742 Personal history of other diseases of the female genital tract: Secondary | ICD-10-CM | POA: Insufficient documentation

## 2020-12-19 DIAGNOSIS — Z8619 Personal history of other infectious and parasitic diseases: Secondary | ICD-10-CM

## 2020-12-19 NOTE — Progress Notes (Signed)
  Subjective:     Patient ID: Rebecca Roy, female   DOB: [birth year], 20 y.o.   MRN: [id number]  HPI  Rebecca Roy is a 20 year old black female,single, G0P0, in for ER follow up was treated for PID and +GC 11/24/20, and she feels much better, has not had sex, since treatment.   Review of Systems Denies any pain, or discharge  Has not has sex since treatment  Reviewed past medical,surgical, social and family history. Reviewed medications and allergies.     Objective:   Physical Exam BP 107/66 (BP Location: Left Arm, Patient Position: Sitting, Cuff Size: Normal)   Pulse 68   Ht 5\' 3"  (1.6 m)   Wt 121 lb 8 oz (55.1 kg)   LMP 12/14/2020   BMI 21.52 kg/m  Skin warm and dry.Pelvic: external genitalia is normal in appearance no lesions, vagina:pink, no discharge,urethra has no lesions or masses noted, cervix:smooth,no CMT uterus: normal size, shape and contour, non tender, no masses felt, adnexa: no masses or tenderness noted. Bladder is non tender and no masses felt. CV swab obtained.   AA is 0 Fall risk is low Depression screen [facility] 2/9 12/19/2020 11/22/2015  Decreased Interest 0 0  Down, Depressed, Hopeless 0 0  PHQ - 2 Score 0 0  Altered sleeping 0 -  Tired, decreased energy 0 -  Change in appetite 1 -  Feeling bad or failure about yourself  0 -  Trouble concentrating 0 -  Moving slowly or fidgety/restless 0 -  Suicidal thoughts 0 -  PHQ-9 Score 1 -    GAD 7 : Generalized Anxiety Score 12/19/2020  Nervous, Anxious, on Edge 0  Control/stop worrying 0  Worry too much - different things 1  Trouble relaxing 0  Restless 0  Easily annoyed or irritable 1  Afraid - awful might happen 0  Total GAD 7 Score 2   .  Upstream - 12/19/20 1513       Pregnancy Intention Screening   Does the patient want to become pregnant in the next year? No    Does the patient's partner want to become pregnant in the next year? No    Would the patient like to discuss contraceptive options today? No       Contraception Wrap Up   Current Method Female Condom    End Method Female Condom    Contraception Counseling Provided No            Examination chaperoned by Levy Pupa LPN  Assessment:     1. History of gonorrhea CV swab sent for GC/CHL,trich and BV and yeast   2. History of PID CV swab sent     Plan:     Return in October for pap and physical

## 2020-12-21 LAB — CERVICOVAGINAL ANCILLARY ONLY
Bacterial Vaginitis (gardnerella): NEGATIVE
Candida Glabrata: NEGATIVE
Candida Vaginitis: NEGATIVE
Chlamydia: NEGATIVE
Comment: NEGATIVE
Comment: NEGATIVE
Comment: NEGATIVE
Comment: NEGATIVE
Comment: NEGATIVE
Comment: NORMAL
Neisseria Gonorrhea: NEGATIVE
Trichomonas: NEGATIVE

## 2021-03-05 ENCOUNTER — Emergency Department (HOSPITAL_COMMUNITY)
Admission: EM | Admit: 2021-03-05 | Discharge: 2021-03-05 | Discharge status: Against Medical Advice | Payer: Medicaid Other

## 2021-03-05 NOTE — ED Notes (Signed)
Pt called for triage, no answer

## 2021-03-06 ENCOUNTER — Emergency Department (HOSPITAL_COMMUNITY)
Admission: EM | Admit: 2021-03-06 | Discharge: 2021-03-06 | Disposition: A | Discharge status: Home / Self Care | Payer: Medicaid Other | Attending: Emergency Medicine | Admitting: Emergency Medicine

## 2021-03-06 ENCOUNTER — Encounter (HOSPITAL_COMMUNITY): Payer: Self-pay | Admitting: Emergency Medicine

## 2021-03-06 DIAGNOSIS — L0231 Cutaneous abscess of buttock: Secondary | ICD-10-CM | POA: Insufficient documentation

## 2021-03-06 DIAGNOSIS — R2242 Localized swelling, mass and lump, left lower limb: Secondary | ICD-10-CM | POA: Diagnosis present

## 2021-03-06 MED ORDER — DOXYCYCLINE HYCLATE 100 MG PO CAPS: 100.0000 mg | ORAL_CAPSULE | Freq: Two times a day (BID) | ORAL | 0 refills | Status: DC

## 2021-03-06 MED ORDER — DIAZEPAM 5 MG PO TABS
5.0000 mg | ORAL_TABLET | Freq: Once | ORAL | Status: AC
  Administered 2021-03-06: 5 mg via ORAL
  Filled 2021-03-06: qty 1

## 2021-03-06 MED ORDER — ACETAMINOPHEN 500 MG PO TABS
1000.0000 mg | ORAL_TABLET | Freq: Once | ORAL | Status: AC
  Administered 2021-03-06: 1000 mg via ORAL
  Filled 2021-03-06: qty 2

## 2021-03-06 MED ORDER — OXYCODONE HCL 5 MG PO TABS
5.0000 mg | ORAL_TABLET | Freq: Once | ORAL | Status: AC
  Administered 2021-03-06: 5 mg via ORAL
  Filled 2021-03-06: qty 1

## 2021-03-06 MED ORDER — LIDOCAINE-EPINEPHRINE (PF) 2 %-1:200000 IJ SOLN
10.0000 mL | Freq: Once | INTRAMUSCULAR | Status: AC
  Administered 2021-03-06: 10 mL via INTRADERMAL
  Filled 2021-03-06: qty 20

## 2021-03-06 NOTE — ED Provider Notes (Signed)
..  Incision and Drainage  Date/Time: 03/06/2021 12:57 PM Performed by: Jacqlyn Larsen, PA-C Authorized by: Jacqlyn Larsen, PA-C   Consent:    Consent obtained:  Verbal   Consent given by:  Patient   Risks discussed:  Bleeding   Alternatives discussed:  No treatment Universal protocol:    Procedure explained and questions answered to patient or proxy's satisfaction: yes     Patient identity confirmed:  Verbally with patient Location:    Type:  Pilonidal cyst   Location:  Anogenital   Anogenital location:  Pilonidal Pre-procedure details:    Skin preparation:  Antiseptic wash Sedation:    Sedation type:  Anxiolysis Anesthesia:    Anesthesia method:  Local infiltration   Local anesthetic:  Lidocaine 2% WITH epi Procedure type:    Complexity:  Complex Procedure details:    Incision types:  Single straight   Incision depth:  Dermal   Wound management:  Probed and deloculated   Drainage:  Bloody and purulent   Drainage amount:  Copious   Wound treatment:  Wound left open   Packing materials:  1/4 in iodoform gauze Post-procedure details:    Procedure completion:  Tolerated well, no immediate complications    Jacqlyn Larsen, PA-C 03/06/21 [place], DO 03/06/21 1309

## 2021-03-06 NOTE — ED Triage Notes (Signed)
Pt states that she has had an abscess on her buttocks above her gluteal cleft x 3 days. Alert and oriented.

## 2021-03-06 NOTE — ED Provider Notes (Signed)
[facility] DEPT Provider Note   CSN: [id number] Arrival date & time: 03/06/21  0915     History Chief Complaint  Patient presents with   Abscess    Rebecca Roy is a 20 y.o. female.  20 yo F with a chief complaints of swelling to her left buttock.'s been going on for about 3 days.  She had a family member told her she had a fever yesterday but not having sensation of fevers or chills.  She denies any drainage to the area.  Denies any trauma to the skin.  The history is provided by the patient and a significant other.  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 No pertinent surgical history.   OB History     Gravida  0   Para  0   Term  0   Preterm  0   AB  0   Living  0      SAB  0   IAB  0   Ectopic  0   Multiple  0   Live Births  0           Family History  Problem Relation Age of Onset   Heart disease Maternal Grandmother    Diabetes Maternal Grandmother    Hypertension Maternal Grandmother    Other Maternal Grandfather        blood clot in lung   Heart disease Maternal Aunt    Diabetes Maternal Aunt    Hypertension Maternal Aunt     Social History   Tobacco Use   Smoking status: Never    Passive exposure: Yes   Smokeless tobacco: Never   Vaping Use   Vaping Use: Every day   Substances: Nicotine, Flavoring  Substance Use Topics   Alcohol use: No   Drug use: Yes    Types: Marijuana    Comment: everyday    Home Medications Prior to Admission medications   Medication Sig Start Date End Date Taking? Authorizing Provider  doxycycline (VIBRAMYCIN) 100 MG capsule Take 1 capsule (100 mg total) by mouth 2 (two) times daily. One po bid x 7 days 03/06/21  Yes Deno Etienne, DO  acetaminophen (TYLENOL) 325 MG tablet Take 2 tablets (650 mg total) by mouth every 6 (six) hours as needed for mild pain, moderate pain or fever. 03/21/17   Jean Rosenthal, NP    Allergies    Patient has no known allergies.  Review of Systems   Review of Systems  Constitutional:  Negative for chills and fever.  HENT:  Negative for congestion and rhinorrhea.   Eyes:  Negative for redness and visual disturbance.  Respiratory:  Negative for shortness of breath and wheezing.   Cardiovascular:  Negative for chest pain and palpitations.  Gastrointestinal:  Negative for nausea  and vomiting.  Genitourinary:  Negative for dysuria and urgency.  Musculoskeletal:  Negative for arthralgias and myalgias.  Skin:  Positive for color change. Negative for pallor and wound.  Neurological:  Negative for dizziness and headaches.   Physical Exam Updated Vital Signs BP 104/67 (BP Location: Right Arm)   Pulse 75   Temp 98.6 F (37 C) (Oral)   Resp 16   Ht 5\' 3"  (1.6 m)   Wt 58.5 kg   LMP 03/02/2021 (Approximate)   SpO2 100%   BMI 22.85 kg/m   Physical Exam Vitals and nursing note reviewed.  Constitutional:      General: She is not in acute distress.    Appearance: She is well-developed. She is not diaphoretic.  HENT:     Head: Normocephalic and atraumatic.  Eyes:     Pupils: Pupils are equal, round, and reactive to light.  Cardiovascular:     Rate and Rhythm: Normal rate and regular rhythm.     Heart sounds: No murmur heard.   No friction rub. No  gallop.  Pulmonary:     Effort: Pulmonary effort is normal.     Breath sounds: No wheezing or rales.  Abdominal:     General: There is no distension.     Palpations: Abdomen is soft.     Tenderness: There is no abdominal tenderness.  Genitourinary:      Comments: Are of erythema and swelling Musculoskeletal:        General: No tenderness.     Cervical back: Normal range of motion and neck supple.  Skin:    General: Skin is warm and dry.  Neurological:     Mental Status: She is alert and oriented to person, place, and time.  Psychiatric:        Behavior: Behavior normal.    ED Results / Procedures / Treatments   Labs (all labs ordered are listed, but only abnormal results are displayed) Labs Reviewed - No data to display  EKG None  Radiology No results found.  Procedures Procedures   Medications Ordered in ED Medications  lidocaine-EPINEPHrine (XYLOCAINE W/EPI) 2 %-1:200000 (PF) injection 10 mL (10 mLs Intradermal Given by Other 03/06/21 1233)  acetaminophen (TYLENOL) tablet 1,000 mg (1,000 mg Oral Given 03/06/21 1221)  oxyCODONE (Oxy IR/ROXICODONE) immediate release tablet 5 mg (5 mg Oral Given 03/06/21 1222)  diazepam (VALIUM) tablet 5 mg (5 mg Oral Given 03/06/21 1222)    ED Course  I have reviewed the triage vital signs and the nursing notes.  Pertinent labs & imaging results that were available during my care of the patient were reviewed by me and considered in my medical decision making (see chart for details).    MDM Rules/Calculators/A&P                           21 yo F with a chief complaints of left buttock swelling.  Clinically the patient has an abscess.  Will attempt I&D at bedside.  I&D successful, seem to track to the pilonidal region.  Will start on antibiotics and have her follow-up with general surgery in the office.  12:59 PM:  I have discussed the diagnosis/risks/treatment options with the patient and family and believe the pt to be eligible  for discharge home to follow-up with Gen surgery. We also discussed returning to the ED immediately if new or worsening sx occur. We discussed the sx which are most concerning (e.g., sudden worsening pain,  fever, inability to tolerate by mouth, rapid spreading redness) that necessitate immediate return. Medications administered to the patient during their visit and any new prescriptions provided to the patient are listed below.  Medications given during this visit Medications  lidocaine-EPINEPHrine (XYLOCAINE W/EPI) 2 %-1:200000 (PF) injection 10 mL (10 mLs Intradermal Given by Other 03/06/21 1233)  acetaminophen (TYLENOL) tablet 1,000 mg (1,000 mg Oral Given 03/06/21 1221)  oxyCODONE (Oxy IR/ROXICODONE) immediate release tablet 5 mg (5 mg Oral Given 03/06/21 1222)  diazepam (VALIUM) tablet 5 mg (5 mg Oral Given 03/06/21 1222)     The patient appears reasonably screen and/or stabilized for discharge and I doubt any other medical condition or other [facility] requiring further screening, evaluation, or treatment in the ED at this time prior to discharge.   Final Clinical Impression(s) / ED Diagnoses Final diagnoses:  Abscess of buttock, left    Rx / DC Orders ED Discharge Orders          Ordered    doxycycline (VIBRAMYCIN) 100 MG capsule  2 times daily        03/06/21 [place], Naylea Wigington, DO 03/06/21 1259

## 2021-03-06 NOTE — Discharge Instructions (Addendum)
Warm compression at least 4 times a day.  Follow-up in 48 hours to have your packing removed and reevaluated.  Please contact general surgery as they may need to do a surgical procedure to keep this from returning.  Take the antibiotics as prescribed.  Please return for rapid spreading redness or if you develop a fever.

## 2021-04-12 ENCOUNTER — Other Ambulatory Visit: Payer: Medicaid Other | Admitting: Adult Health

## 2021-05-08 ENCOUNTER — Other Ambulatory Visit: Payer: Medicaid Other | Admitting: Adult Health

## 2021-05-09 ENCOUNTER — Other Ambulatory Visit: Payer: Medicaid Other | Admitting: Advanced Practice Midwife

## 2021-06-11 ENCOUNTER — Encounter (HOSPITAL_COMMUNITY): Payer: Self-pay

## 2021-06-11 ENCOUNTER — Emergency Department (HOSPITAL_COMMUNITY)
Admission: EM | Admit: 2021-06-11 | Discharge: 2021-06-11 | Disposition: A | Discharge status: Home / Self Care | Payer: Medicaid Other | Attending: Emergency Medicine | Admitting: Emergency Medicine

## 2021-06-11 DIAGNOSIS — N760 Acute vaginitis: Secondary | ICD-10-CM | POA: Insufficient documentation

## 2021-06-11 DIAGNOSIS — Z7722 Contact with and (suspected) exposure to environmental tobacco smoke (acute) (chronic): Secondary | ICD-10-CM | POA: Insufficient documentation

## 2021-06-11 DIAGNOSIS — N739 Female pelvic inflammatory disease, unspecified: Secondary | ICD-10-CM | POA: Diagnosis not present

## 2021-06-11 DIAGNOSIS — B9689 Other specified bacterial agents as the cause of diseases classified elsewhere: Secondary | ICD-10-CM

## 2021-06-11 DIAGNOSIS — R109 Unspecified abdominal pain: Secondary | ICD-10-CM | POA: Diagnosis present

## 2021-06-11 LAB — URINALYSIS, ROUTINE W REFLEX MICROSCOPIC
Bilirubin Urine: NEGATIVE
Glucose, UA: NEGATIVE mg/dL
Hgb urine dipstick: NEGATIVE
Ketones, ur: 40 mg/dL — AB
Nitrite: NEGATIVE
Protein, ur: NEGATIVE mg/dL
Specific Gravity, Urine: 1.02 (ref 1.005–1.030)
pH: 8 (ref 5.0–8.0)

## 2021-06-11 LAB — WET PREP, GENITAL
Sperm: NONE SEEN
Trich, Wet Prep: NONE SEEN
WBC, Wet Prep HPF POC: >=10 — AB (ref <10)
Yeast Wet Prep HPF POC: NONE SEEN

## 2021-06-11 LAB — POC URINE PREG, ED: Preg Test, Ur: NEGATIVE

## 2021-06-11 LAB — URINALYSIS, MICROSCOPIC (REFLEX)

## 2021-06-11 LAB — PREGNANCY, URINE: Preg Test, Ur: NEGATIVE

## 2021-06-11 MED ORDER — LIDOCAINE HCL (PF) 1 % IJ SOLN
1.0000 mL | Freq: Once | INTRAMUSCULAR | Status: AC
  Administered 2021-06-11: 23:00:00 1 mL
  Filled 2021-06-11: qty 30

## 2021-06-11 MED ORDER — CEFTRIAXONE SODIUM 500 MG IJ SOLR
500.0000 mg | Freq: Once | INTRAMUSCULAR | Status: AC
  Administered 2021-06-11: 23:00:00 500 mg via INTRAMUSCULAR
  Filled 2021-06-11: qty 500

## 2021-06-11 MED ORDER — DOXYCYCLINE HYCLATE 100 MG PO CAPS: 100.0000 mg | ORAL_CAPSULE | Freq: Two times a day (BID) | ORAL | 0 refills | Status: DC

## 2021-06-11 MED ORDER — METRONIDAZOLE 500 MG PO TABS: 500.0000 mg | ORAL_TABLET | Freq: Two times a day (BID) | ORAL | 0 refills | Status: DC

## 2021-06-11 NOTE — ED Triage Notes (Signed)
Pt to ED c/o abdominal pain and vaginal discharge over past 3 days, new bump on genital area. New sexual partner, concern for STD, Pt reports could be pregnant.

## 2021-06-12 LAB — RPR: RPR Ser Ql: NONREACTIVE

## 2021-06-12 LAB — GC/CHLAMYDIA PROBE AMP (~~LOC~~) NOT AT ARMC
Chlamydia: NEGATIVE
Comment: NEGATIVE
Comment: NORMAL
Neisseria Gonorrhea: NEGATIVE

## 2021-06-12 LAB — HIV ANTIBODY (ROUTINE TESTING W REFLEX): HIV Screen 4th Generation wRfx: NONREACTIVE

## 2021-06-13 NOTE — ED Provider Notes (Signed)
[facility] EMERGENCY DEPARTMENT Provider Note   CSN: [id number] Arrival date & time: 06/11/21  1423     History Chief Complaint  Patient presents with   Exposure to STD   Abdominal Pain    Rebecca Roy is a 20 y.o. female.   Exposure to STD Associated symptoms include abdominal pain. Pertinent negatives include no shortness of breath.  Abdominal Pain Associated symptoms: vaginal discharge   Associated symptoms: no dysuria and no shortness of breath   Patient presents with pelvic pain.  Vaginal discharge.  Is had over the last 3 days.  Worried she could have another STD.  States posteriorly there is a swelling.  Does have a new sexual partner.  Is wondering if she could be pregnant.  Pain is in her lower abdomen.  No fevers or chills.  No coughing.  No dysuria.    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   OB History     Gravida  0   Para  0   Term  0   Preterm  0   AB  0   Living  0      SAB  0   IAB  0   Ectopic  0   Multiple  0   Live Births  0           Family History  Problem Relation Age of Onset   Heart disease Maternal Grandmother    Diabetes Maternal Grandmother    Hypertension Maternal Grandmother    Other Maternal Grandfather        blood clot in lung   Heart disease Maternal Aunt    Diabetes Maternal Aunt    Hypertension Maternal Aunt     Social History   Tobacco Use   Smoking status: Never    Passive exposure: Yes   Smokeless tobacco: Never  Vaping Use   Vaping Use: Every day   Substances: Nicotine, Flavoring  Substance Use Topics    Alcohol use: No   Drug use: Yes    Types: Marijuana    Comment: everyday    Home Medications Prior to Admission medications   Medication Sig Start Date End Date Taking? Authorizing Provider  metroNIDAZOLE (FLAGYL) 500 MG tablet Take 1 tablet (500 mg total) by mouth 2 (two) times daily. 06/11/21  Yes Davonna Belling, MD  acetaminophen (TYLENOL) 325 MG tablet Take 2 tablets (650 mg total) by mouth every 6 (six) hours as needed for mild pain, moderate pain or fever. 03/21/17   Jean Rosenthal, NP  doxycycline (VIBRAMYCIN) 100 MG capsule Take 1 capsule (100 mg total) by mouth 2 (two) times daily. One po bid x 7 days 06/11/21   Davonna Belling, MD    Allergies    Patient has no known allergies.  Review of Systems   Review of Systems  Constitutional:  Negative for appetite change.  Respiratory:  Negative for shortness of breath.   Cardiovascular:  Negative for leg swelling.  Gastrointestinal:  Positive for abdominal pain.  Genitourinary:  Positive for pelvic pain and vaginal discharge. Negative for dysuria and urgency.  Musculoskeletal:  Negative for back pain.  Skin:  Negative for rash.  Neurological:  Negative for weakness.   Physical Exam Updated Vital Signs BP 125/85    Pulse 80    Temp 98 F (36.7 C) (Oral)    Resp 18    Ht 5\' 3"  (1.6 m)    Wt 59 kg    LMP 05/22/2021    SpO2 100%    BMI 23.03 kg/m   Physical Exam Vitals and nursing note reviewed.  HENT:     Head: Atraumatic.  Abdominal:     Tenderness: There is abdominal tenderness.     Comments: Lower abdominal/suprapubic tenderness.  No rebound or guarding with no hernia palpated.  Genitourinary:    Comments: Some white vaginal discharge.  No cervical motion tenderness without specific adnexal tenderness. Skin:    General: Skin is warm.  Neurological:     Mental Status: She is alert.  Patient with mild perineal erythema posteriorly.  No ulcers.  May have small abrasion.  ED Results / Procedures / Treatments    Labs (all labs ordered are listed, but only abnormal results are displayed) Labs Reviewed  WET PREP, GENITAL - Abnormal; Notable for the following components:      Result Value   Clue Cells Wet Prep HPF POC PRESENT (*)    WBC, Wet Prep HPF POC >=10 (*)    All other components within normal limits  URINALYSIS, ROUTINE W REFLEX MICROSCOPIC - Abnormal; Notable for the following components:   Ketones, ur 40 (*)    Leukocytes,Ua TRACE (*)    All other components within normal limits  URINALYSIS, MICROSCOPIC (REFLEX) - Abnormal; Notable for the following components:   Bacteria, UA MANY (*)    All other components within normal limits  PREGNANCY, URINE  RPR  HIV ANTIBODY (ROUTINE TESTING W REFLEX)  POC URINE PREG, ED  GC/CHLAMYDIA PROBE AMP ([place]) NOT AT [facility]    EKG None  Radiology No results found.  Procedures Procedures   Medications Ordered in ED Medications  cefTRIAXone (ROCEPHIN) injection 500 mg (500 mg Intramuscular Given 06/11/21 2319)  lidocaine (PF) (XYLOCAINE) 1 % injection 1 mL (1 mL Other Given 06/11/21 2319)    ED Course  I have reviewed the triage vital signs and the nursing notes.  Pertinent labs & imaging results that were available during my care of the patient were reviewed by me and considered in my medical decision making (see chart for details).    MDM Rules/Calculators/A&P                         Patient with vaginal discharge.  BV and white cells on wet prep.  Will treat for STDs.  We will also give metronidazole.  With cervical motion tenderness will treat for PID.  Does not have adnexal tenderness.  Doubt tubo-ovarian abscess.  Some posterior tenderness in the perineum.  No vesicles or ulcers.  Herpetic lesions felt less likely.  Follow-up with her gynecologist as needed.    Final Clinical Impression(s) / ED Diagnoses Final diagnoses:  BV (bacterial vaginosis)  PID (pelvic inflammatory disease)    Rx / DC Orders ED Discharge Orders           Ordered    doxycycline (VIBRAMYCIN) 100 MG capsule  2 times daily        06/11/21 2314    metroNIDAZOLE (FLAGYL) 500 MG tablet  2 times daily  06/11/21 2314             Davonna Belling, MD 06/13/21 [phone]

## 2021-11-02 ENCOUNTER — Ambulatory Visit: Payer: Medicaid Other | Admitting: Physician Assistant

## 2021-11-07 ENCOUNTER — Ambulatory Visit: Payer: Medicaid Other | Admitting: Physician Assistant

## 2022-02-03 ENCOUNTER — Encounter (HOSPITAL_COMMUNITY): Payer: Self-pay | Admitting: Emergency Medicine

## 2022-02-03 ENCOUNTER — Ambulatory Visit (HOSPITAL_COMMUNITY)
Admission: EM | Admit: 2022-02-03 | Discharge: 2022-02-03 | Disposition: A | Discharge status: Home / Self Care | Payer: Medicaid Other | Attending: Physician Assistant | Admitting: Physician Assistant

## 2022-02-03 DIAGNOSIS — L0103 Bullous impetigo: Secondary | ICD-10-CM

## 2022-02-03 MED ORDER — CEPHALEXIN 500 MG PO CAPS: 500.0000 mg | ORAL_CAPSULE | Freq: Three times a day (TID) | ORAL | 0 refills | Status: DC

## 2022-02-03 NOTE — ED Triage Notes (Signed)
Pt reports over week ago had bump come up on left upper leg that was pimple like. Popped and got out pus. Skin around bump burned then came off. Reports had two more bumps come up on that same leg as well.

## 2022-02-03 NOTE — Discharge Instructions (Signed)
Advised to take Keflex 500 mg 1 3 times a day until completed. Antibiotic should treat the rash development and prevent it from occurring after you have been on the antibiotic for couple days. If the rash fails to resolve and it continues after you finish the antibiotic or if the rash gets worse to return to urgent care for reevaluation.

## 2022-02-03 NOTE — ED Provider Notes (Signed)
[facility]    CSN: [id number] Arrival date & time: 02/03/22  1232      History   Chief Complaint Chief Complaint  Patient presents with   Skin Problem    HPI Rebecca Roy is a 21 y.o. female.   21 year old female presents with rash on left thigh.  She indicates that several days ago she noticed a pustule on her left anterior thigh, this then blistered up, ruptured and started to heal however then she noticed 2 other additional ones adjacent to it that started to blister up and then ruptured and started to heal.  Patient indicates that the areas were slightly itchy, she did get to get some mild pain from the areas.  Patient indicates that she has not had any blisters anywhere else on her skin that she is aware of.  Patient does indicate that she is exposed to a lot of children on a regular basis but has not been around any family or friends with any unusual rashes.  Patient denies any fever or chills.     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 No pertinent surgical history.  OB History     Gravida  0   Para  0   Term  0   Preterm  0   AB  0   Living  0      SAB  0   IAB  0   Ectopic  0   Multiple  0   Live Births  0            Home Medications    Prior to Admission medications   Medication Sig Start Date End Date Taking? Authorizing Provider  cephALEXin (KEFLEX) 500 MG capsule Take 1 capsule (500 mg total) by mouth 3 (three) times daily. 02/03/22  Yes Nyoka Lint, PA-C  acetaminophen (TYLENOL) 325 MG tablet Take 2 tablets (650 mg total) by mouth every 6 (six) hours as  needed for mild pain, moderate pain or fever. 03/21/17   Jean Rosenthal, NP  doxycycline (VIBRAMYCIN) 100 MG capsule Take 1 capsule (100 mg total) by mouth 2 (two) times daily. One po bid x 7 days 06/11/21   Davonna Belling, MD  metroNIDAZOLE (FLAGYL) 500 MG tablet Take 1 tablet (500 mg total) by mouth 2 (two) times daily. 06/11/21   Davonna Belling, MD    Family History Family History  Problem Relation Age of Onset   Heart disease Maternal Grandmother    Diabetes Maternal Grandmother    Hypertension Maternal Grandmother    Other Maternal Grandfather        blood clot in lung   Heart disease Maternal Aunt    Diabetes Maternal Aunt    Hypertension Maternal Aunt     Social History Social History   Tobacco Use   Smoking status: Never    Passive exposure: Yes   Smokeless tobacco: Never  Vaping Use   Vaping Use: Every day   Substances: Nicotine, Flavoring  Substance Use Topics   Alcohol use: No   Drug use: Yes    Types: Marijuana    Comment: everyday  Allergies   Patient has no known allergies.   Review of Systems Review of Systems  Skin:  Positive for rash (bisters left thigh).     Physical Exam Triage Vital Signs ED Triage Vitals  Enc Vitals Group     BP 02/03/22 1314 112/72     Pulse Rate 02/03/22 1314 (!) 52     Resp 02/03/22 1314 15     Temp 02/03/22 1314 98.5 F (36.9 C)     Temp Source 02/03/22 1314 Oral     SpO2 02/03/22 1314 98 %     Weight --      Height --      Head Circumference --      Peak Flow --      Pain Score 02/03/22 1313 0     Pain Loc --      Pain Edu? --      Excl. in [place]? --    No data found.  Updated Vital Signs BP 112/72 (BP Location: Left Arm)   Pulse (!) 52   Temp 98.5 F (36.9 C) (Oral)   Resp 15   LMP 01/16/2022   SpO2 98%   Visual Acuity Right Eye Distance:   Left Eye Distance:   Bilateral Distance:    Right Eye Near:   Left Eye Near:    Bilateral Near:     Physical Exam Constitutional:       Appearance: Normal appearance.  Skin:         Comments: Left thigh: There are 3 separate 0.5 cm circular bullous type lesions that have ruptured and are in the healing process.  One of the lesions has some raw skin showing from where the skin has been rubbed away.  There is no drainage from these areas, the redness is localized to just the lesion, there is no streaking.  Neurological:     Mental Status: She is alert.      UC Treatments / Results  Labs (all labs ordered are listed, but only abnormal results are displayed) Labs Reviewed - No data to display  EKG   Radiology No results found.  Procedures Procedures (including critical care time)  Medications Ordered in UC Medications - No data to display  Initial Impression / Assessment and Plan / UC Course  I have reviewed the triage vital signs and the nursing notes.  Pertinent labs & imaging results that were available during my care of the patient were reviewed by me and considered in my medical decision making (see chart for details).    Plan: 1.  Patient advised take Keflex 500 mg 1 every 8 hours until completed to treat the bullous impetigo. 2.  Patient advised to follow-up PCP or return to urgent care if symptoms fail to improve. 1.  Patient advised take Keflex 200Final Clinical Impressions(s) / UC Diagnoses   Final diagnoses:  Bullous impetigo     Discharge Instructions      Advised to take Keflex 500 mg 1 3 times a day until completed. Antibiotic should treat the rash development and prevent it from occurring after you have been on the antibiotic for couple days. If the rash fails to resolve and it continues after you finish the antibiotic or if the rash gets worse to return to urgent care for reevaluation.    ED Prescriptions     Medication Sig Dispense Auth. Provider   cephALEXin (KEFLEX) 500 MG capsule Take 1 capsule (500 mg total) by mouth 3 (three) times daily.  21 capsule Nyoka Lint, PA-C       PDMP not reviewed this encounter.   Nyoka Lint, PA-C 02/03/22 1408

## 2022-03-25 ENCOUNTER — Ambulatory Visit (INDEPENDENT_AMBULATORY_CARE_PROVIDER_SITE_OTHER): Payer: Medicaid Other

## 2022-03-25 ENCOUNTER — Encounter (HOSPITAL_COMMUNITY): Payer: Self-pay | Admitting: Emergency Medicine

## 2022-03-25 ENCOUNTER — Ambulatory Visit (HOSPITAL_COMMUNITY)
Admission: EM | Admit: 2022-03-25 | Discharge: 2022-03-25 | Disposition: A | Discharge status: Home / Self Care | Payer: Medicaid Other | Attending: Emergency Medicine | Admitting: Emergency Medicine

## 2022-03-25 DIAGNOSIS — K5909 Other constipation: Secondary | ICD-10-CM | POA: Diagnosis not present

## 2022-03-25 DIAGNOSIS — K5900 Constipation, unspecified: Secondary | ICD-10-CM

## 2022-03-25 DIAGNOSIS — R1030 Lower abdominal pain, unspecified: Secondary | ICD-10-CM | POA: Diagnosis not present

## 2022-03-25 MED ORDER — LACTULOSE 10 GM/15ML PO SOLN: 10.0000 g | Freq: Every day | ORAL | 0 refills | Status: DC | PRN

## 2022-03-25 NOTE — Discharge Instructions (Signed)
X-ray was negative for any obstruction and therefore we will move forward with today with treating your constipation  Take 1 dose of lactulose every morning for the next 3 days, may stop medicine once you have a complete bowel movement  After you have had a good bowel movement you may begin use of a daily stool softener or eating a high-fiber diet to keep your regular, while these medications will not not make you go to the bathroom it will make it easier when you actually have to, inside this packet is information on a high-fiber diet  You have used medicine for 3 days and there have been no improvement in your symptoms and I would recommend that you go to the nearest emergency department for further evaluation and management  You may use Tylenol and/or ibuprofen as needed for abdominal pain  Until you are able to tolerate food as, please increase your fluid intake with primarily water and electrolyte replacement substances such as Gatorade to prevent dehydration

## 2022-03-25 NOTE — ED Triage Notes (Signed)
Pt c/o abd pains with no BM for 3 days. Denies n/v or urinary problems.

## 2022-03-25 NOTE — ED Provider Notes (Signed)
[facility]    CSN: [id number] Arrival date & time: 03/25/22  0940      History   Chief Complaint Chief Complaint  Patient presents with   Abdominal Pain    HPI Reagyn Facemire is a 21 y.o. female.   Patient presents with constant lower abdominal pain for 3 days.  Endorses pain fluctuates from the left to right side.  Has not been unable to pass gas and has had a decreased appetite as food worsens symptoms.  Has not had a bowel movement in 3 days whereas she typically goes every 1 to 2 days.  Denies fever, chills, nausea, vomiting, abdominal bloating.  Has not attempted treatment of symptoms.  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 No pertinent surgical history.  OB History     Gravida  0   Para  0   Term  0   Preterm  0   AB  0   Living  0      SAB  0   IAB  0   Ectopic  0   Multiple  0   Live Births  0            Home Medications    Prior to Admission medications   Medication Sig Start Date End Date Taking? Authorizing Provider  acetaminophen (TYLENOL) 325 MG tablet Take 2 tablets (650 mg total) by mouth every 6 (six) hours as needed for mild pain, moderate pain or fever. 03/21/17   Jean Rosenthal, NP  cephALEXin (KEFLEX) 500 MG capsule Take 1 capsule (500 mg total) by mouth 3 (three) times daily. 02/03/22   Nyoka Lint, PA-C  doxycycline (VIBRAMYCIN) 100 MG capsule Take 1 capsule (100 mg total) by mouth 2 (two) times daily. One po bid x 7 days 06/11/21   Davonna Belling, MD  metroNIDAZOLE (FLAGYL) 500 MG tablet Take 1 tablet (500 mg total) by mouth 2 (two) times  daily. 06/11/21   Davonna Belling, MD    Family History Family History  Problem Relation Age of Onset   Heart disease Maternal Grandmother    Diabetes Maternal Grandmother    Hypertension Maternal Grandmother    Other Maternal Grandfather        blood clot in lung   Heart disease Maternal Aunt    Diabetes Maternal Aunt    Hypertension Maternal Aunt     Social History Social History   Tobacco Use   Smoking status: Never    Passive exposure: Yes   Smokeless tobacco: Never  Vaping Use   Vaping Use: Every day   Substances: Nicotine, Flavoring  Substance Use Topics   Alcohol use: No   Drug use: Yes    Types: Marijuana    Comment: everyday     Allergies   Patient has no known allergies.   Review of Systems Review of Systems  Constitutional: Negative.   HENT: Negative.    Respiratory: Negative.    Cardiovascular: Negative.   Gastrointestinal:  Positive for abdominal pain and constipation. Negative for abdominal distention, anal bleeding, blood in stool, diarrhea, nausea, rectal pain and vomiting.  Skin:  Negative.   Neurological: Negative.      Physical Exam Triage Vital Signs ED Triage Vitals  Enc Vitals Group     BP 03/25/22 1014 112/60     Pulse Rate 03/25/22 1014 82     Resp 03/25/22 1014 16     Temp 03/25/22 1014 98.6 F (37 C)     Temp Source 03/25/22 1014 Oral     SpO2 03/25/22 1014 100 %     Weight --      Height --      Head Circumference --      Peak Flow --      Pain Score 03/25/22 1013 6     Pain Loc --      Pain Edu? --      Excl. in [place]? --    No data found.  Updated Vital Signs BP 112/60 (BP Location: Left Arm)   Pulse 82   Temp 98.6 F (37 C) (Oral)   Resp 16   LMP 03/07/2022   SpO2 100%   Visual Acuity Right Eye Distance:   Left Eye Distance:   Bilateral Distance:    Right Eye Near:   Left Eye Near:    Bilateral Near:     Physical Exam Constitutional:      Appearance: Normal appearance.  Pulmonary:     Effort:  Pulmonary effort is normal.  Abdominal:     General: Abdomen is flat. Bowel sounds are normal.     Palpations: Abdomen is soft.     Tenderness: There is abdominal tenderness in the right lower quadrant and left lower quadrant.  Neurological:     General: No focal deficit present.     Mental Status: She is alert and oriented to person, place, and time.  Psychiatric:        Mood and Affect: Mood normal.        Behavior: Behavior normal.      UC Treatments / Results  Labs (all labs ordered are listed, but only abnormal results are displayed) Labs Reviewed - No data to display  EKG   Radiology No results found.  Procedures Procedures (including critical care time)  Medications Ordered in UC Medications - No data to display  Initial Impression / Assessment and Plan / UC Course  I have reviewed the triage vital signs and the nursing notes.  Pertinent labs & imaging results that were available during my care of the patient were reviewed by me and considered in my medical decision making (see chart for details).  Lower abdominal pain, constipation  On exam there is tenderness throughout the lower abdomen however low suspicion for an acute abdomen due to lack of accompanying symptoms, x-ray of the abdomen is negative for obstruction, discussed with patient, prescribed lactulose until bowel movement occurs, advised not to take more than 3 days, once a bowel movement has occurred may discontinue medication, advised increase fluid intake and a high-fiber diet in addition to this, advised stool softener and high-fiber diet moving forward to prevent reoccurrence, if symptoms have not improved after 3 days of medication use recommended follow-up evaluation in the emergency department Final Clinical Impressions(s) / UC Diagnoses   Final diagnoses:  None   Discharge Instructions   None    ED Prescriptions   None    PDMP not reviewed this encounter.   Hans Eden,  NP 03/25/22 1117

## 2022-03-26 ENCOUNTER — Emergency Department (HOSPITAL_COMMUNITY)
Admission: EM | Admit: 2022-03-26 | Discharge: 2022-03-26 | Discharge status: Against Medical Advice | Payer: Medicaid Other | Attending: Emergency Medicine | Admitting: Emergency Medicine

## 2022-03-26 ENCOUNTER — Other Ambulatory Visit: Payer: Self-pay

## 2022-03-26 ENCOUNTER — Encounter (HOSPITAL_COMMUNITY): Payer: Self-pay

## 2022-03-26 DIAGNOSIS — H7491 Unspecified disorder of right middle ear and mastoid: Secondary | ICD-10-CM | POA: Diagnosis not present

## 2022-03-26 DIAGNOSIS — R1031 Right lower quadrant pain: Secondary | ICD-10-CM | POA: Insufficient documentation

## 2022-03-26 DIAGNOSIS — Z5321 Procedure and treatment not carried out due to patient leaving prior to being seen by health care provider: Secondary | ICD-10-CM | POA: Insufficient documentation

## 2022-03-26 LAB — CBC WITH DIFFERENTIAL/PLATELET
Abs Immature Granulocytes: 0.07 10*3/uL (ref 0.00–0.07)
Basophils Absolute: 0 10*3/uL (ref 0.0–0.1)
Basophils Relative: 0 %
Eosinophils Absolute: 0.1 10*3/uL (ref 0.0–0.5)
Eosinophils Relative: 1 %
HCT: 38.8 % (ref 36.0–46.0)
Hemoglobin: 12.6 g/dL (ref 12.0–15.0)
Immature Granulocytes: 1 %
Lymphocytes Relative: 12 %
Lymphs Abs: 1.8 10*3/uL (ref 0.7–4.0)
MCH: 29.7 pg (ref 26.0–34.0)
MCHC: 32.5 g/dL (ref 30.0–36.0)
MCV: 91.5 fL (ref 80.0–100.0)
Monocytes Absolute: 1.4 10*3/uL — ABNORMAL HIGH (ref 0.1–1.0)
Monocytes Relative: 10 %
Neutro Abs: 11.3 10*3/uL — ABNORMAL HIGH (ref 1.7–7.7)
Neutrophils Relative %: 76 %
Platelets: 398 10*3/uL (ref 150–400)
RBC: 4.24 MIL/uL (ref 3.87–5.11)
RDW: 13.2 % (ref 11.5–15.5)
WBC: 14.7 10*3/uL — ABNORMAL HIGH (ref 4.0–10.5)
nRBC: 0 % (ref 0.0–0.2)

## 2022-03-26 LAB — COMPREHENSIVE METABOLIC PANEL
ALT: 12 U/L (ref 0–44)
AST: 16 U/L (ref 15–41)
Albumin: 3.8 g/dL (ref 3.5–5.0)
Alkaline Phosphatase: 61 U/L (ref 38–126)
Anion gap: 7 (ref 5–15)
BUN: 8 mg/dL (ref 6–20)
CO2: 23 mmol/L (ref 22–32)
Calcium: 8.8 mg/dL — ABNORMAL LOW (ref 8.9–10.3)
Chloride: 107 mmol/L (ref 98–111)
Creatinine, Ser: 0.81 mg/dL (ref 0.44–1.00)
GFR, Estimated: >60 mL/min (ref >60)
Glucose, Bld: 108 mg/dL — ABNORMAL HIGH (ref 70–99)
Potassium: 4.2 mmol/L (ref 3.5–5.1)
Sodium: 137 mmol/L (ref 135–145)
Total Bilirubin: 0.6 mg/dL (ref 0.3–1.2)
Total Protein: 7.5 g/dL (ref 6.5–8.1)

## 2022-03-26 LAB — URINALYSIS, ROUTINE W REFLEX MICROSCOPIC
Bilirubin Urine: NEGATIVE
Glucose, UA: NEGATIVE mg/dL
Hgb urine dipstick: NEGATIVE
Ketones, ur: NEGATIVE mg/dL
Nitrite: NEGATIVE
Protein, ur: 30 mg/dL — AB
Specific Gravity, Urine: 1.029 (ref 1.005–1.030)
WBC, UA: >50 WBC/hpf — ABNORMAL HIGH (ref 0–5)
pH: 5 (ref 5.0–8.0)

## 2022-03-26 LAB — LIPASE, BLOOD: Lipase: 30 U/L (ref 11–51)

## 2022-03-26 LAB — I-STAT BETA HCG BLOOD, ED (MC, WL, AP ONLY): I-stat hCG, quantitative: <5 m[IU]/mL (ref <5)

## 2022-03-26 NOTE — ED Triage Notes (Signed)
Patient was seen here yesterday for same was given lactulose took it yesterday but not today and call EMS to come back to the hospital.  Xray did not show blockage yesterday.

## 2022-03-26 NOTE — ED Notes (Signed)
Pt called for recheck vitals, no aswer

## 2022-03-26 NOTE — ED Provider Triage Note (Signed)
Emergency Medicine Provider Triage Evaluation Note  Rebecca Roy , a 21 y.o. female  was evaluated in triage.  Pt complains of lower abdominal pain, RLQ pain, x 5 days. Worse with movement, walking.  Last bowel movement 3-4 days ago (not normal), taking rx from UC yesterday (lactulose, not helping). LMP 03/07/22 Abdominal surgeries- none Review of Systems  Positive: Abdominal pain, n/v, constipation, urinary frequency without dysuria  Negative: fever  Physical Exam  BP 111/77 (BP Location: Right Arm)   Pulse 86   Temp 99.1 F (37.3 C) (Oral)   Resp 16   LMP 03/07/2022   SpO2 100%  Gen:   Awake, no distress   Resp:  Normal effort  MSK:   Moves extremities without difficulty  Other:    Medical Decision Making  Medically screening exam initiated at 1:57 PM.  Appropriate orders placed.  Rebecca Roy was informed that the remainder of the evaluation will be completed by another provider, this initial triage assessment does not replace that evaluation, and the importance of remaining in the ED until their evaluation is complete.     Tacy Learn, PA-C 03/26/22 1358

## 2022-03-26 NOTE — ED Notes (Signed)
Pt called for recheck vitals multiple times no answer

## 2022-03-27 ENCOUNTER — Encounter (HOSPITAL_COMMUNITY): Payer: Self-pay | Admitting: Emergency Medicine

## 2022-03-27 ENCOUNTER — Emergency Department (HOSPITAL_COMMUNITY): Payer: Medicaid Other

## 2022-03-27 ENCOUNTER — Inpatient Hospital Stay (HOSPITAL_COMMUNITY)
Admission: EM | Admit: 2022-03-27 | Discharge: 2022-04-01 | DRG: 872 | Disposition: A | Discharge status: Home / Self Care | Payer: Medicaid Other | Attending: Obstetrics & Gynecology | Admitting: Obstetrics & Gynecology

## 2022-03-27 DIAGNOSIS — F17290 Nicotine dependence, other tobacco product, uncomplicated: Secondary | ICD-10-CM | POA: Diagnosis present

## 2022-03-27 DIAGNOSIS — Z8249 Family history of ischemic heart disease and other diseases of the circulatory system: Secondary | ICD-10-CM | POA: Diagnosis not present

## 2022-03-27 DIAGNOSIS — N739 Female pelvic inflammatory disease, unspecified: Principal | ICD-10-CM

## 2022-03-27 DIAGNOSIS — D72829 Elevated white blood cell count, unspecified: Secondary | ICD-10-CM | POA: Diagnosis present

## 2022-03-27 DIAGNOSIS — N7093 Salpingitis and oophoritis, unspecified: Secondary | ICD-10-CM | POA: Diagnosis present

## 2022-03-27 DIAGNOSIS — Z833 Family history of diabetes mellitus: Secondary | ICD-10-CM

## 2022-03-27 DIAGNOSIS — A419 Sepsis, unspecified organism: Principal | ICD-10-CM | POA: Diagnosis present

## 2022-03-27 DIAGNOSIS — H7491 Unspecified disorder of right middle ear and mastoid: Secondary | ICD-10-CM

## 2022-03-27 DIAGNOSIS — K5900 Constipation, unspecified: Secondary | ICD-10-CM | POA: Diagnosis present

## 2022-03-27 LAB — COMPREHENSIVE METABOLIC PANEL
ALT: 11 U/L (ref 0–44)
AST: 17 U/L (ref 15–41)
Albumin: 3.7 g/dL (ref 3.5–5.0)
Alkaline Phosphatase: 63 U/L (ref 38–126)
Anion gap: 12 (ref 5–15)
BUN: 11 mg/dL (ref 6–20)
CO2: 19 mmol/L — ABNORMAL LOW (ref 22–32)
Calcium: 9 mg/dL (ref 8.9–10.3)
Chloride: 105 mmol/L (ref 98–111)
Creatinine, Ser: 0.99 mg/dL (ref 0.44–1.00)
GFR, Estimated: >60 mL/min (ref >60)
Glucose, Bld: 113 mg/dL — ABNORMAL HIGH (ref 70–99)
Potassium: 4.1 mmol/L (ref 3.5–5.1)
Sodium: 136 mmol/L (ref 135–145)
Total Bilirubin: 1.3 mg/dL — ABNORMAL HIGH (ref 0.3–1.2)
Total Protein: 7.6 g/dL (ref 6.5–8.1)

## 2022-03-27 LAB — CBC WITH DIFFERENTIAL/PLATELET
Abs Immature Granulocytes: 0.17 10*3/uL — ABNORMAL HIGH (ref 0.00–0.07)
Basophils Absolute: 0 10*3/uL (ref 0.0–0.1)
Basophils Relative: 0 %
Eosinophils Absolute: 0 10*3/uL (ref 0.0–0.5)
Eosinophils Relative: 0 %
HCT: 37.5 % (ref 36.0–46.0)
Hemoglobin: 12.6 g/dL (ref 12.0–15.0)
Immature Granulocytes: 1 %
Lymphocytes Relative: 7 %
Lymphs Abs: 1.5 10*3/uL (ref 0.7–4.0)
MCH: 30.3 pg (ref 26.0–34.0)
MCHC: 33.6 g/dL (ref 30.0–36.0)
MCV: 90.1 fL (ref 80.0–100.0)
Monocytes Absolute: 1.1 10*3/uL — ABNORMAL HIGH (ref 0.1–1.0)
Monocytes Relative: 5 %
Neutro Abs: 19.8 10*3/uL — ABNORMAL HIGH (ref 1.7–7.7)
Neutrophils Relative %: 87 %
Platelets: 385 10*3/uL (ref 150–400)
RBC: 4.16 MIL/uL (ref 3.87–5.11)
RDW: 13.1 % (ref 11.5–15.5)
WBC: 22.6 10*3/uL — ABNORMAL HIGH (ref 4.0–10.5)
nRBC: 0 % (ref 0.0–0.2)

## 2022-03-27 LAB — URINALYSIS, ROUTINE W REFLEX MICROSCOPIC
Bilirubin Urine: NEGATIVE
Glucose, UA: NEGATIVE mg/dL
Ketones, ur: NEGATIVE mg/dL
Nitrite: NEGATIVE
Protein, ur: 30 mg/dL — AB
Specific Gravity, Urine: 1.027 (ref 1.005–1.030)
pH: 5 (ref 5.0–8.0)

## 2022-03-27 LAB — LACTIC ACID, PLASMA: Lactic Acid, Venous: 1.1 mmol/L (ref 0.5–1.9)

## 2022-03-27 LAB — WET PREP, GENITAL
Clue Cells Wet Prep HPF POC: NONE SEEN
Sperm: NONE SEEN
WBC, Wet Prep HPF POC: >=10 — AB (ref <10)
Yeast Wet Prep HPF POC: NONE SEEN

## 2022-03-27 LAB — I-STAT BETA HCG BLOOD, ED (MC, WL, AP ONLY): I-stat hCG, quantitative: <5 m[IU]/mL (ref <5)

## 2022-03-27 LAB — LIPASE, BLOOD: Lipase: 26 U/L (ref 11–51)

## 2022-03-27 LAB — APTT: aPTT: 30 seconds (ref 24–36)

## 2022-03-27 LAB — PROTIME-INR
INR: 1.2 (ref 0.8–1.2)
Prothrombin Time: 14.8 seconds (ref 11.4–15.2)

## 2022-03-27 MED ORDER — OXYCODONE-ACETAMINOPHEN 5-325 MG PO TABS
1.0000 | ORAL_TABLET | ORAL | Status: DC | PRN
  Administered 2022-03-27: 1 via ORAL
  Administered 2022-03-28: 2 via ORAL
  Administered 2022-03-28: 1 via ORAL
  Filled 2022-03-27 (×3): qty 1
  Filled 2022-03-27: qty 2

## 2022-03-27 MED ORDER — IOHEXOL 350 MG/ML SOLN
75.0000 mL | Freq: Once | INTRAVENOUS | Status: AC | PRN
  Administered 2022-03-27: 75 mL via INTRAVENOUS

## 2022-03-27 MED ORDER — SODIUM CHLORIDE 0.9 % IV SOLN
2.0000 g | INTRAVENOUS | Status: DC
  Administered 2022-03-28 – 2022-03-29 (×2): 2 g via INTRAVENOUS
  Filled 2022-03-27 (×2): qty 20

## 2022-03-27 MED ORDER — SODIUM CHLORIDE 0.9 % IV SOLN
1.0000 g | INTRAVENOUS | Status: DC
  Administered 2022-03-27: 1 g via INTRAVENOUS
  Filled 2022-03-27: qty 10

## 2022-03-27 MED ORDER — LACTATED RINGERS IV BOLUS (SEPSIS)
1000.0000 mL | Freq: Once | INTRAVENOUS | Status: AC
  Administered 2022-03-27: 1000 mL via INTRAVENOUS

## 2022-03-27 MED ORDER — BISACODYL 10 MG RE SUPP
10.0000 mg | Freq: Every day | RECTAL | Status: DC | PRN
  Administered 2022-03-29: 10 mg via RECTAL
  Filled 2022-03-27: qty 1

## 2022-03-27 MED ORDER — ENOXAPARIN SODIUM 40 MG/0.4ML IJ SOSY
40.0000 mg | PREFILLED_SYRINGE | INTRAMUSCULAR | Status: DC
  Administered 2022-03-27 – 2022-03-31 (×5): 40 mg via SUBCUTANEOUS
  Filled 2022-03-27 (×5): qty 0.4

## 2022-03-27 MED ORDER — FENTANYL CITRATE PF 50 MCG/ML IJ SOSY
50.0000 ug | PREFILLED_SYRINGE | Freq: Once | INTRAMUSCULAR | Status: AC
  Administered 2022-03-27: 50 ug via INTRAVENOUS
  Filled 2022-03-27: qty 1

## 2022-03-27 MED ORDER — PRENATAL MULTIVITAMIN CH
1.0000 | ORAL_TABLET | Freq: Every day | ORAL | Status: DC
  Administered 2022-03-29 – 2022-03-31 (×3): 1 via ORAL
  Filled 2022-03-27 (×5): qty 1

## 2022-03-27 MED ORDER — HYDROMORPHONE HCL 1 MG/ML IJ SOLN
0.2000 mg | INTRAMUSCULAR | Status: DC | PRN
  Administered 2022-03-27 – 2022-03-28 (×5): 0.5 mg via INTRAVENOUS
  Filled 2022-03-27: qty 0.5
  Filled 2022-03-27: qty 1
  Filled 2022-03-27 (×3): qty 0.5

## 2022-03-27 MED ORDER — ONDANSETRON HCL 4 MG PO TABS: 4.0000 mg | ORAL_TABLET | Freq: Four times a day (QID) | ORAL | Status: DC | PRN

## 2022-03-27 MED ORDER — OXYCODONE-ACETAMINOPHEN 5-325 MG PO TABS: 1.0000 | ORAL_TABLET | Freq: Once | ORAL | Status: DC

## 2022-03-27 MED ORDER — ONDANSETRON HCL 4 MG/2ML IJ SOLN
4.0000 mg | Freq: Once | INTRAMUSCULAR | Status: AC
  Administered 2022-03-27: 4 mg via INTRAVENOUS
  Filled 2022-03-27: qty 2

## 2022-03-27 MED ORDER — LACTATED RINGERS IV SOLN: INTRAVENOUS | Status: DC

## 2022-03-27 MED ORDER — SENNOSIDES-DOCUSATE SODIUM 8.6-50 MG PO TABS
1.0000 | ORAL_TABLET | Freq: Every evening | ORAL | Status: DC | PRN
  Administered 2022-03-28: 1 via ORAL
  Filled 2022-03-27 (×2): qty 1

## 2022-03-27 MED ORDER — MORPHINE SULFATE (PF) 4 MG/ML IV SOLN
4.0000 mg | Freq: Once | INTRAVENOUS | Status: AC
  Administered 2022-03-27: 4 mg via INTRAVENOUS
  Filled 2022-03-27: qty 1

## 2022-03-27 MED ORDER — METRONIDAZOLE 500 MG/100ML IV SOLN
500.0000 mg | Freq: Two times a day (BID) | INTRAVENOUS | Status: DC
  Administered 2022-03-27 – 2022-03-29 (×5): 500 mg via INTRAVENOUS
  Filled 2022-03-27 (×4): qty 100

## 2022-03-27 MED ORDER — SODIUM CHLORIDE 0.9 % IV SOLN
1.0000 g | Freq: Once | INTRAVENOUS | Status: AC
  Administered 2022-03-27: 1 g via INTRAVENOUS
  Filled 2022-03-27: qty 10

## 2022-03-27 MED ORDER — ONDANSETRON HCL 4 MG/2ML IJ SOLN
4.0000 mg | Freq: Four times a day (QID) | INTRAMUSCULAR | Status: DC | PRN
  Administered 2022-03-27 – 2022-03-29 (×2): 4 mg via INTRAVENOUS
  Filled 2022-03-27 (×2): qty 2

## 2022-03-27 MED ORDER — SODIUM CHLORIDE 0.9 % IV SOLN
100.0000 mg | Freq: Two times a day (BID) | INTRAVENOUS | Status: DC
  Administered 2022-03-27 – 2022-03-31 (×9): 100 mg via INTRAVENOUS
  Filled 2022-03-27 (×9): qty 100

## 2022-03-27 MED ORDER — LACTATED RINGERS IV BOLUS
1000.0000 mL | Freq: Once | INTRAVENOUS | Status: AC
  Administered 2022-03-27: 1000 mL via INTRAVENOUS

## 2022-03-27 NOTE — ED Provider Notes (Signed)
[facility] EMERGENCY DEPARTMENT Provider Note   CSN: [id number] Arrival date & time: 03/27/22  0453     History  Chief Complaint  Patient presents with   Abdominal Pain    Rebecca Roy is a 21 y.o. female.  Patient presents to the emergency department via EMS complaining of lower abdominal pain, vaginal bleeding, nausea, and vomiting.  Patient states that for the past 3 to 4 days she has had lower abdominal pain.  She was evaluated at an urgent care on Monday and was diagnosed with constipation and prescribed laxatives.  She states she has not had a bowel movement in approximately 4 days.  The patient states her pain continued and she is still not had a bowel movement.  At 3 AM this morning she awoke abruptly with severe, sharp suprapubic and right lower quadrant pain.  She also noticed that she had had vaginal bleeding during the episode.  She states she had her last menstrual period on September 21.  She denies vaginal discharge, dysuria, hematuria.  Endorses nausea and vomiting that began with a sharp pain this morning.  Patient denies shortness of breath, chest pain.  Past medical history significant for gonorrhea  HPI     Home Medications Prior to Admission medications   Medication Sig Start Date End Date Taking? Authorizing Provider  acetaminophen (TYLENOL) 325 MG tablet Take 2 tablets (650 mg total) by mouth every 6 (six) hours as needed for mild pain, moderate pain or fever. 03/21/17  Yes Scoville, Kennis Carina, NP  lactulose (CHRONULAC) 10 GM/15ML solution Take 15 mLs (10 g total) by mouth daily as needed for mild constipation. 03/25/22  Yes White, Adrienne R, NP  cephALEXin (KEFLEX) 500 MG capsule Take 1 capsule (500 mg total) by mouth 3 (three) times daily. Patient not taking: Reported on 03/27/2022 02/03/22   Nyoka Lint, PA-C      Allergies    Patient has no known allergies.    Review of Systems   Review of Systems  Constitutional:  Negative for  fever.  Respiratory:  Negative for shortness of breath.   Cardiovascular:  Negative for chest pain.  Gastrointestinal:  Positive for abdominal pain, constipation, nausea and vomiting. Negative for diarrhea.  Genitourinary:  Positive for vaginal bleeding. Negative for dysuria, hematuria, vaginal discharge and vaginal pain.    Physical Exam Updated Vital Signs BP (!) 113/100   Pulse 94   Temp 98.7 F (37.1 C) (Oral)   Resp (!) 21   LMP 03/07/2022   SpO2 100%  Physical Exam Vitals and nursing note reviewed. Exam conducted with a chaperone present.  Constitutional:      General: She is not in acute distress.    Appearance: She is well-developed.  HENT:     Head: Normocephalic and atraumatic.  Eyes:     Extraocular Movements: Extraocular movements intact.     Conjunctiva/sclera: Conjunctivae normal.  Cardiovascular:     Rate and Rhythm: Regular rhythm. Tachycardia present.     Heart sounds: No murmur heard. Pulmonary:     Effort: Pulmonary effort is normal. No respiratory distress.     Breath sounds: Normal breath sounds.  Abdominal:     General: There is no distension.     Palpations: Abdomen is soft.     Tenderness: There is abdominal tenderness in the right lower quadrant and suprapubic area. There is no right CVA tenderness or left CVA tenderness.  Genitourinary:    Cervix: Cervical motion tenderness and discharge present.  Adnexa:        Right: Tenderness present.   Musculoskeletal:        General: No swelling.     Cervical back: Neck supple.  Skin:    General: Skin is warm and dry.     Capillary Refill: Capillary refill takes less than 2 seconds.  Neurological:     Mental Status: She is alert.  Psychiatric:        Mood and Affect: Mood normal.     ED Results / Procedures / Treatments   Labs (all labs ordered are listed, but only abnormal results are displayed) Labs Reviewed  WET PREP, GENITAL - Abnormal; Notable for the following components:      Result  Value   Trich, Wet Prep PRESENT (*)    WBC, Wet Prep HPF POC >=10 (*)    All other components within normal limits  COMPREHENSIVE METABOLIC PANEL - Abnormal; Notable for the following components:   CO2 19 (*)    Glucose, Bld 113 (*)    Total Bilirubin 1.3 (*)    All other components within normal limits  CBC WITH DIFFERENTIAL/PLATELET - Abnormal; Notable for the following components:   WBC 22.6 (*)    Neutro Abs 19.8 (*)    Monocytes Absolute 1.1 (*)    Abs Immature Granulocytes 0.17 (*)    All other components within normal limits  URINALYSIS, ROUTINE W REFLEX MICROSCOPIC - Abnormal; Notable for the following components:   Color, Urine AMBER (*)    APPearance CLOUDY (*)    Hgb urine dipstick SMALL (*)    Protein, ur 30 (*)    Leukocytes,Ua MODERATE (*)    Bacteria, UA FEW (*)    All other components within normal limits  CULTURE, BLOOD (ROUTINE X 2)  CULTURE, BLOOD (ROUTINE X 2)  URINE CULTURE  LIPASE, BLOOD  LACTIC ACID, PLASMA  PROTIME-INR  APTT  I-STAT BETA HCG BLOOD, ED ([facility], [facility], [facility] ONLY)  GC/CHLAMYDIA PROBE AMP ([place]) NOT AT [facility]    EKG EKG Interpretation  Date/Time:  Wednesday March 27 2022 07:39:08 EDT Ventricular Rate:  92 PR Interval:  132 QRS Duration: 77 QT Interval:  337 QTC Calculation: 417 R Axis:   84 Text Interpretation: Sinus rhythm Confirmed by Octaviano Glow [phone]) on 03/27/2022 7:41:27 AM  Radiology US PELVIC COMPLETE W TRANSVAGINAL AND TORSION R/O  Result Date: 03/27/2022 CLINICAL DATA:  [id number] Pelvic pain 101751 negative urine pregnancy test EXAM: TRANSABDOMINAL AND TRANSVAGINAL ULTRASOUND OF PELVIS DOPPLER ULTRASOUND OF OVARIES TECHNIQUE: Both transabdominal and transvaginal ultrasound examinations of the pelvis were performed. Transabdominal technique was performed for global imaging of the pelvis including uterus, ovaries, adnexal regions, and pelvic cul-de-sac. It was necessary to proceed with endovaginal exam following the  transabdominal exam to visualize the endometrium and ovaries/adnexa. Color and duplex Doppler ultrasound was utilized to evaluate blood flow to the ovaries. COMPARISON:  Same day CT. FINDINGS: Uterus Measurements: 6.5 x 3.5 x 4.0 cm = volume: 48.0 mL. No fibroids or other mass visualized. Endometrium Thickness: 3.5 mm.  No focal abnormality visualized. Right ovary Measurements: 7.0 x 4.8 x 4.4 cm = volume: 77.4 mL. Enlarged ovary due to the presence of a cystic lesion measuring 2.5 x 2.1 x 2.6 cm. This cystic lesion demonstrates internal heterogeneous debris, fluid-fluid level, and peripheral vascularity. There is adjacent dilated structure which is favored to be a fallopian tube with internal complex fluid. Left ovary Measurements: 4.4 x 2.2 x 2.3 cm = volume: 10.7 mL.  The left ovary is unremarkable. There is complex fluid along the left adnexa. Pulsed Doppler evaluation of both ovaries demonstrates normal low-resistance arterial and venous waveforms. Other findings There is moderate volume complex free fluid in the pelvis. IMPRESSION: Findings are highly suspicious for pelvic inflammatory disease with right-sided tubo-ovarian abscess and moderate volume complex free fluid in the pelvis and along both adnexa which may be pus. Ruptured hemorrhagic cyst/hematosalpinx is a possibility but considered less likely given the patient's leukocytosis. Electronically Signed   By: Maurine Simmering M.D.   On: 03/27/2022 09:43   CT ABDOMEN PELVIS W CONTRAST  Result Date: 03/27/2022 CLINICAL DATA:  21 year old female with right lower quadrant pain for 5 days. EXAM: CT ABDOMEN AND PELVIS WITH CONTRAST TECHNIQUE: Multidetector CT imaging of the abdomen and pelvis was performed using the standard protocol following bolus administration of intravenous contrast. RADIATION DOSE REDUCTION: This exam was performed according to the departmental dose-optimization program which includes automated exposure control, adjustment of the mA  and/or kV according to patient size and/or use of iterative reconstruction technique. CONTRAST:  16m OMNIPAQUE IOHEXOL 350 MG/ML SOLN COMPARISON:  Noncontrast CT Abdomen and Pelvis 11/24/2020. FINDINGS: Lower chest: Negative. Hepatobiliary: Negative liver and gallbladder. Pancreas: Negative. Spleen: Negative. Adrenals/Urinary Tract: Normal adrenal glands. Both kidneys are symmetrically excreting contrast at the time of these initial images, with evidence of severe motion artifact on earlier 0530 hours attempt. Relatively little ureteral contrast, but in the pelvis the bladder seems to be completely decompressed (sagittal image 62), and oval hyperdensity at the neck of the bladder is felt to be a small volume of excreted IV contrast. No convincing urinary calculus. Stomach/Bowel: Decompressed rectosigmoid colon. Mild upstream retained gas and stool in the large bowel through the transverse colon. Mostly fluid and some gas in the right colon which is nondilated. Cecum is partially located in the anterior pelvis. Appendix is difficult to delineate, but questionable appendix at the upper limits of normal on coronal image 44. No convincing pericecal inflammation. Fluid-filled upper limits of normal to mildly dilated small bowel loops throughout the pelvis which seem to demonstrate mucosal hyperenhancement (series 3, image 71). But upstream jejunum is decompressed and appears normal. No transition. No free air. No free fluid identified in the abdomen. Stomach and duodenum appear negative. Vascular/Lymphatic: Suboptimal intravascular contrast. Normal caliber abdominal aorta. No calcified atherosclerosis or lymphadenopathy identified. Reproductive: Uterus and left adnexa appear within normal limits. Right adnexa is asymmetrically enlarged, with a mildly complex fluid density lesion measuring up to 3 cm on series 3, image 78. Questionable right side hydrosalpinx associated. Other: Fluid in the cul-de-sac (series 3, image  78) has complex fluid density and there is some evidence of adjacent peritoneal thickening and enhancement (series 3, image 78). However, no well delineated abscess. Musculoskeletal: Levoconvex scoliosis. Straightening of lumbar lordosis and lower thoracic kyphosis. No acute osseous abnormality identified. IMPRESSION: 1. Initially motion degraded exam, repeated with small volume of excreted IV contrast suspected in otherwise decompressed urinary bladder in the pelvis. 2. Abnormal Pelvis, with small volume of complex free fluid and possible peritoneal thickening in the cul-de-sac (raising the possibility of infected fluid collection), asymmetric right adnexa with questionable Hydrosalpinx, and mildly inflamed appearing distal small bowel loops. Appendix is not clearly delineated in the absence of oral contrast, but there is no definite large bowel inflammation. No bowel obstruction. Consider PID and/or Right Side Tubo-ovarian Abscess with secondary inflammation of distal small bowel. Follow-up Transabdominal And Transvaginal Pelvis Ultrasound may be valuable. 3. Negative  CT Abdomen. Electronically Signed   By: Genevie Ann M.D.   On: 03/27/2022 06:01   DG Abd 1 View  Result Date: 03/25/2022 CLINICAL DATA:  Constipation EXAM: ABDOMEN - 1 VIEW COMPARISON:  None Available. FINDINGS: The bowel gas pattern is normal. No radio-opaque calculi or other significant radiographic abnormality are seen. Mild levocurvature of the lumbar spine. IMPRESSION: Normal bowel-gas pattern. Electronically Signed   By: Yetta Glassman M.D.   On: 03/25/2022 10:42    Procedures .Critical Care  Performed by: Dorothyann Peng, PA-C Authorized by: Dorothyann Peng, PA-C   Critical care provider statement:    Critical care time (minutes):  30   Critical care was necessary to treat or prevent imminent or life-threatening deterioration of the following conditions:  Sepsis   Critical care was time spent personally by me on the following  activities:  Development of treatment plan with patient or surrogate, discussions with consultants, evaluation of patient's response to treatment, examination of patient, ordering and review of laboratory studies, ordering and review of radiographic studies, ordering and performing treatments and interventions, pulse oximetry, re-evaluation of patient's condition and review of old charts   Care discussed with: admitting provider       Medications Ordered in ED Medications  doxycycline (VIBRAMYCIN) 100 mg in sodium chloride 0.9 % 250 mL IVPB (100 mg Intravenous New Bag/Given 03/27/22 0858)  cefTRIAXone (ROCEPHIN) 1 g in sodium chloride 0.9 % 100 mL IVPB (0 g Intravenous Stopped 03/27/22 0854)  metroNIDAZOLE (FLAGYL) IVPB 500 mg (0 mg Intravenous Stopped 03/27/22 1002)  lactated ringers infusion (has no administration in time range)  lactated ringers bolus 1,000 mL (has no administration in time range)    And  lactated ringers bolus 1,000 mL (1,000 mLs Intravenous New Bag/Given 03/27/22 1031)  fentaNYL (SUBLIMAZE) injection 50 mcg (50 mcg Intravenous Given 03/27/22 0510)  iohexol (OMNIPAQUE) 350 MG/ML injection 75 mL (75 mLs Intravenous Contrast Given 03/27/22 0537)  lactated ringers bolus 1,000 mL (0 mLs Intravenous Stopped 03/27/22 0854)  morphine (PF) 4 MG/ML injection 4 mg (4 mg Intravenous Given 03/27/22 0731)  ondansetron (ZOFRAN) injection 4 mg (4 mg Intravenous Given 03/27/22 0733)  morphine (PF) 4 MG/ML injection 4 mg (4 mg Intravenous Given 03/27/22 0934)    ED Course/ Medical Decision Making/ A&P Clinical Course as of 03/27/22 1032  Wed Mar 27, 2022  0749 This is a 21 yo female presenting to ED with acute onset RLQ abdominal pain, awoke from sleep 3 AM, +Nausea and vomiting, abdominal "bloated."  She reports there is also a "small puddle of blood" in the bed when she woke up, and says that she just came off of her period but this was unusual for her.  On exam she is afebrile, was  initially tachycardic but heart rate is improved.  She appears uncomfortable.  She does have diffuse tenderness and some guarding of the lower abdomen, particularly suprapubic and right lower quadrant.  CT scan ordered and performed after triage was concerning for possible ovarian or pelvic infection, also showing formation of the bowel in the right lower quadrant.  Appendix is only partially visualized but did not appear overtly inflamed.  Lab work shows a leukocytosis of 22,000.  Blood cultures were ordered.  Patient is receiving IV fluids and broad-spectrum antibiotics for pelvic and intra-abdominal infection, including Flagyl, Rocephin, and doxycycline.  Ultrasound department has been contacted now, and will be coming for torsion/TOA ultrasound.  Patient required additional morphine for pain control.  As  she is leaving for ultrasound, pelvic exam planned upon her return.  Pt is NPO. [MT]  [id number] Ultrasound consistent with TOA, clinical exam (pelvic) consistent with PID w/ CMT - this is likely source of symptoms.  Pt on appropriate antibiotics for sepsis 2/2 TOA.  Will consult OBGYN and anticipate admission. [MT]    Clinical Course User Index [MT] Trifan, Carola Rhine, MD                           Medical Decision Making Amount and/or Complexity of Data Reviewed Labs: ordered. ECG/medicine tests: ordered.  Risk Prescription drug management.   This patient presents to the ED for concern of lower abdominal pain, this involves an extensive number of treatment options, and is a complaint that carries with it a high risk of complications and morbidity.  The differential diagnosis includes appendicitis, pelvic inflammatory disease, tubo-ovarian abscess, cholecystitis, gastroenteritis, sepsis, others   Co morbidities that complicate the patient evaluation  History of gonorrhea   Additional history obtained:  Additional history obtained from EMS External records from outside source obtained and  reviewed including urgent care notes from October 9 for lower abdominal pain, treated for likely constipation.  Patient also seen in December 2022 and treated for likely pelvic inflammatory disease with no signs at that time of TOA.   Lab Tests:  I Ordered, and personally interpreted labs.  The pertinent results include: WBC 22.6, urinalysis with hemoglobin on the dipstick, 30 protein, moderate leukocytes, few bacteria, negative pregnancy test   Imaging Studies ordered:  I ordered imaging studies including CT abdomen pelvis with contrast and complete pelvic ultrasound with transvaginal torsion rule out I independently visualized and interpreted imaging which showed  1. Initially motion degraded exam, repeated with small volume of  excreted IV contrast suspected in otherwise decompressed urinary  bladder in the pelvis.    2. Abnormal Pelvis, with small volume of complex free fluid and  possible peritoneal thickening in the cul-de-sac (raising the  possibility of infected fluid collection), asymmetric right adnexa  with questionable Hydrosalpinx, and mildly inflamed appearing distal  small bowel loops.  Appendix is not clearly delineated in the absence of oral contrast,  but there is no definite large bowel inflammation. No bowel  obstruction.  Consider PID and/or Right Side Tubo-ovarian Abscess with secondary  inflammation of distal small bowel.  Follow-up Transabdominal And Transvaginal Pelvis Ultrasound may be  valuable.    3. Negative CT Abdomen.   I agree with the radiologist interpretation   Cardiac Monitoring: / EKG:  The patient was maintained on a cardiac monitor.  I personally viewed and interpreted the cardiac monitored which showed an underlying rhythm of: Sinus rhythm   Consultations Obtained:  I requested consultation with the GYN physician on call. Dr. Elonda Husky,  and discussed lab and imaging findings as well as pertinent plan - they recommend: continue with ordered  antibiotics, plan to admit to their service   Problem List / ED Course / Critical interventions / Medication management   I ordered medication including fentanyl and morphine for pain, Zofran for nausea, doxycycline, Rocephin, Flagyl for possible PID, LR boluses and maintenance drips for sepsis Reevaluation of the patient after these medicines showed that the patient improved I have reviewed the patients home medicines and have made adjustments as needed   Test / Admission - Considered:  The patient appears to have pelvic inflammatory disease with a likely right-sided TOA with concerning free fluid.  Patient needs admission for continued antibiotics and further management with gynecology.  Admit to hospital.        Final Clinical Impression(s) / ED Diagnoses Final diagnoses:  Pelvic inflammatory disease (PID)  Sepsis without acute organ dysfunction, due to unspecified organism [facility])    Rx / DC Orders ED Discharge Orders     None         Ronny Bacon 03/27/22 1032    Wyvonnia Dusky, MD 03/27/22 1247

## 2022-03-27 NOTE — ED Notes (Signed)
ED TO INPATIENT HANDOFF REPORT  ED Nurse Name and Phone #: Jomari Bartnik RN [phone]  S Name/Age/Gender Rebecca Roy 21 y.o. female Room/Bed: 036C/036C  Code Status   Code Status: Full Code  Home/SNF/Other Home Patient oriented to: self, place, time, and situation Is this baseline? Yes   Triage Complete: Triage complete  Chief Complaint Sepsis [facility]) [A41.9] Right tubo-ovarian abscess [N70.93]  Triage Note Pt here for abd pain x5 days. Pt described pain as stabbing and is in lower abd. Pt reports some vaginal bleeding this morning. Pt thinks she may be pregnant, test from yesterday was negative. 132/80, 98HR, cbg 121   Allergies No Known Allergies  Level of Care/Admitting Diagnosis ED Disposition     ED Disposition  Admit   Condition  --   Comment  Hospital Area: [place] [[id number]]  Level of Care: Med-Surg [16]  May admit patient to [facility] or [facility] if equivalent level of care is available:: Yes  Covid Evaluation: Asymptomatic - no recent exposure (last 10 days) testing not required  Diagnosis: Right tubo-ovarian abscess [7124580]  Admitting Physician: Florian Buff [[id number]]  Attending Physician: Florian Buff [[id number]]  Certification:: I certify this patient will need inpatient services for at least 2 midnights          B Medical/Surgery History Past Medical History:  Diagnosis Date   Gonorrhea    No past surgical history on file.   A IV Location/Drains/Wounds Patient Lines/Drains/Airways Status     Active Line/Drains/Airways     Name Placement date Placement time Site Days   Peripheral IV 03/27/22 20 G Right Antecubital 03/27/22  0510  Antecubital  less than 1   Peripheral IV 03/27/22 20 G Anterior;Left;Proximal Forearm 03/27/22  0754  Forearm  less than 1            Intake/Output Last 24 hours No intake or output data in the 24 hours ending 03/27/22 2218  Labs/Imaging Results for orders placed or performed during the  hospital encounter of 03/27/22 (from the past 48 hour(s))  Comprehensive metabolic panel     Status: Abnormal   Collection Time: 03/27/22  4:57 AM  Result Value Ref Range   Sodium 136 135 - 145 mmol/L   Potassium 4.1 3.5 - 5.1 mmol/L   Chloride 105 98 - 111 mmol/L   CO2 19 (L) 22 - 32 mmol/L   Glucose, Bld 113 (H) 70 - 99 mg/dL    Comment: Glucose reference range applies only to samples taken after fasting for at least 8 hours.   BUN 11 6 - 20 mg/dL   Creatinine, Ser 0.99 0.44 - 1.00 mg/dL   Calcium 9.0 8.9 - 10.3 mg/dL   Total Protein 7.6 6.5 - 8.1 g/dL   Albumin 3.7 3.5 - 5.0 g/dL   AST 17 15 - 41 U/L   ALT 11 0 - 44 U/L   Alkaline Phosphatase 63 38 - 126 U/L   Total Bilirubin 1.3 (H) 0.3 - 1.2 mg/dL   GFR, Estimated >60 >60 mL/min    Comment: (NOTE) Calculated using the CKD-EPI Creatinine Equation (2021)    Anion gap 12 5 - 15    Comment: Performed at [facility] [address] [place], [place] [address]  Lipase, blood     Status: None   Collection Time: 03/27/22  4:57 AM  Result Value Ref Range   Lipase 26 11 - 51 U/L    Comment: Performed at [facility], [place] [address], [place], [place] [address]  CBC with Diff     Status: Abnormal   Collection Time: 03/27/22  4:57 AM  Result Value Ref Range   WBC 22.6 (H) 4.0 - 10.5 K/uL   RBC 4.16 3.87 - 5.11 MIL/uL   Hemoglobin 12.6 12.0 - 15.0 g/dL   HCT 37.5 36.0 - 46.0 %   MCV 90.1 80.0 - 100.0 fL   MCH 30.3 26.0 - 34.0 pg   MCHC 33.6 30.0 - 36.0 g/dL   RDW 13.1 11.5 - 15.5 %   Platelets 385 150 - 400 K/uL   nRBC 0.0 0.0 - 0.2 %   Neutrophils Relative % 87 %   Neutro Abs 19.8 (H) 1.7 - 7.7 K/uL   Lymphocytes Relative 7 %   Lymphs Abs 1.5 0.7 - 4.0 K/uL   Monocytes Relative 5 %   Monocytes Absolute 1.1 (H) 0.1 - 1.0 K/uL   Eosinophils Relative 0 %   Eosinophils Absolute 0.0 0.0 - 0.5 K/uL   Basophils Relative 0 %   Basophils Absolute 0.0 0.0 - 0.1 K/uL   Immature Granulocytes 1 %   Abs Immature  Granulocytes 0.17 (H) 0.00 - 0.07 K/uL    Comment: Performed at [facility] [address] [place], [place] [address]  Urinalysis, Routine w reflex microscopic     Status: Abnormal   Collection Time: 03/27/22  4:58 AM  Result Value Ref Range   Color, Urine AMBER (A) YELLOW    Comment: BIOCHEMICALS MAY BE AFFECTED BY COLOR   APPearance CLOUDY (A) CLEAR   Specific Gravity, Urine 1.027 1.005 - 1.030   pH 5.0 5.0 - 8.0   Glucose, UA NEGATIVE NEGATIVE mg/dL   Hgb urine dipstick SMALL (A) NEGATIVE   Bilirubin Urine NEGATIVE NEGATIVE   Ketones, ur NEGATIVE NEGATIVE mg/dL   Protein, ur 30 (A) NEGATIVE mg/dL   Nitrite NEGATIVE NEGATIVE   Leukocytes,Ua MODERATE (A) NEGATIVE   RBC / HPF 11-20 0 - 5 RBC/hpf   WBC, UA 21-50 0 - 5 WBC/hpf   Bacteria, UA FEW (A) NONE SEEN   Squamous Epithelial / LPF 0-5 0 - 5   Mucus PRESENT     Comment: Performed at [facility], [address] [place], [place] [address]  I-Stat beta hCG blood, ED     Status: None   Collection Time: 03/27/22  5:02 AM  Result Value Ref Range   I-stat hCG, quantitative <5.0 <5 mIU/mL   Comment 3            Comment:   GEST. AGE      CONC.  (mIU/mL)   <=1 WEEK        5 - 50     2 WEEKS       50 - 500     3 WEEKS       100 - 10,000     4 WEEKS     1,000 - 30,000        FEMALE AND NON-PREGNANT FEMALE:     LESS THAN 5 mIU/mL   Lactic acid, plasma     Status: None   Collection Time: 03/27/22  7:54 AM  Result Value Ref Range   Lactic Acid, Venous 1.1 0.5 - 1.9 mmol/L    Comment: Performed at [facility] [address], [place], [place] [address]  Protime-INR     Status: None   Collection Time: 03/27/22  7:54  AM  Result Value Ref Range   Prothrombin Time 14.8 11.4 - 15.2 seconds   INR 1.2 0.8 - 1.2    Comment: (NOTE) INR goal varies based on device and disease states. Performed at [facility], [place] [address] [place], [place] [address]   APTT     Status: None   Collection Time: 03/27/22  7:54  AM  Result Value Ref Range   aPTT 30 24 - 36 seconds    Comment: Performed at [facility] [address] [place], [place] [address]  Wet prep, genital     Status: Abnormal   Collection Time: 03/27/22  9:28 AM  Result Value Ref Range   Yeast Wet Prep HPF POC NONE SEEN NONE SEEN   Trich, Wet Prep PRESENT (A) NONE SEEN   Clue Cells Wet Prep HPF POC NONE SEEN NONE SEEN   WBC, Wet Prep HPF POC >=10 (A) <10    Comment: Swab received with less than 0.5 mL of saline, saline added to specimen, interpret results with caution.   Sperm NONE SEEN     Comment: Performed at [facility], [place] [address] [place], [place] [address]   US PELVIC COMPLETE W TRANSVAGINAL AND TORSION R/O  Result Date: 03/27/2022 CLINICAL DATA:  [id number] Pelvic pain [id number] negative urine pregnancy test EXAM: TRANSABDOMINAL AND TRANSVAGINAL ULTRASOUND OF PELVIS DOPPLER ULTRASOUND OF OVARIES TECHNIQUE: Both transabdominal and transvaginal ultrasound examinations of the pelvis were performed. Transabdominal technique was performed for global imaging of the pelvis including uterus, ovaries, adnexal regions, and pelvic cul-de-sac. It was necessary to proceed with endovaginal exam following the transabdominal exam to visualize the endometrium and ovaries/adnexa. Color and duplex Doppler ultrasound was utilized to evaluate blood flow to the ovaries. COMPARISON:  Same day CT. FINDINGS: Uterus Measurements: 6.5 x 3.5 x 4.0 cm = volume: 48.0 mL. No fibroids or other mass visualized. Endometrium Thickness: 3.5 mm.  No focal abnormality visualized. Right ovary Measurements: 7.0 x 4.8 x 4.4 cm = volume: 77.4 mL. Enlarged ovary due to the presence of a cystic lesion measuring 2.5 x 2.1 x 2.6 cm. This cystic lesion demonstrates internal heterogeneous debris, fluid-fluid level, and peripheral vascularity. There is adjacent dilated structure which is favored to be a fallopian tube with internal complex fluid. Left ovary Measurements: 4.4 x  2.2 x 2.3 cm = volume: 10.7 mL. The left ovary is unremarkable. There is complex fluid along the left adnexa. Pulsed Doppler evaluation of both ovaries demonstrates normal low-resistance arterial and venous waveforms. Other findings There is moderate volume complex free fluid in the pelvis. IMPRESSION: Findings are highly suspicious for pelvic inflammatory disease with right-sided tubo-ovarian abscess and moderate volume complex free fluid in the pelvis and along both adnexa which may be pus. Ruptured hemorrhagic cyst/hematosalpinx is a possibility but considered less likely given the patient's leukocytosis. Electronically Signed   By: Maurine Simmering M.D.   On: 03/27/2022 09:43   CT ABDOMEN PELVIS W CONTRAST  Result Date: 03/27/2022 CLINICAL DATA:  21 year old female with right lower quadrant pain for 5 days. EXAM: CT ABDOMEN AND PELVIS WITH CONTRAST TECHNIQUE: Multidetector CT imaging of the abdomen and pelvis was performed using the standard protocol following bolus administration of intravenous contrast. RADIATION DOSE REDUCTION: This exam was performed according to the departmental dose-optimization program which includes automated exposure control, adjustment of the mA and/or kV according to patient size and/or use of iterative reconstruction technique. CONTRAST:  67m OMNIPAQUE IOHEXOL 350 MG/ML SOLN  COMPARISON:  Noncontrast CT Abdomen and Pelvis 11/24/2020. FINDINGS: Lower chest: Negative. Hepatobiliary: Negative liver and gallbladder. Pancreas: Negative. Spleen: Negative. Adrenals/Urinary Tract: Normal adrenal glands. Both kidneys are symmetrically excreting contrast at the time of these initial images, with evidence of severe motion artifact on earlier 0530 hours attempt. Relatively little ureteral contrast, but in the pelvis the bladder seems to be completely decompressed (sagittal image 62), and oval hyperdensity at the neck of the bladder is felt to be a small volume of excreted IV contrast. No  convincing urinary calculus. Stomach/Bowel: Decompressed rectosigmoid colon. Mild upstream retained gas and stool in the large bowel through the transverse colon. Mostly fluid and some gas in the right colon which is nondilated. Cecum is partially located in the anterior pelvis. Appendix is difficult to delineate, but questionable appendix at the upper limits of normal on coronal image 44. No convincing pericecal inflammation. Fluid-filled upper limits of normal to mildly dilated small bowel loops throughout the pelvis which seem to demonstrate mucosal hyperenhancement (series 3, image 71). But upstream jejunum is decompressed and appears normal. No transition. No free air. No free fluid identified in the abdomen. Stomach and duodenum appear negative. Vascular/Lymphatic: Suboptimal intravascular contrast. Normal caliber abdominal aorta. No calcified atherosclerosis or lymphadenopathy identified. Reproductive: Uterus and left adnexa appear within normal limits. Right adnexa is asymmetrically enlarged, with a mildly complex fluid density lesion measuring up to 3 cm on series 3, image 78. Questionable right side hydrosalpinx associated. Other: Fluid in the cul-de-sac (series 3, image 78) has complex fluid density and there is some evidence of adjacent peritoneal thickening and enhancement (series 3, image 78). However, no well delineated abscess. Musculoskeletal: Levoconvex scoliosis. Straightening of lumbar lordosis and lower thoracic kyphosis. No acute osseous abnormality identified. IMPRESSION: 1. Initially motion degraded exam, repeated with small volume of excreted IV contrast suspected in otherwise decompressed urinary bladder in the pelvis. 2. Abnormal Pelvis, with small volume of complex free fluid and possible peritoneal thickening in the cul-de-sac (raising the possibility of infected fluid collection), asymmetric right adnexa with questionable Hydrosalpinx, and mildly inflamed appearing distal small bowel  loops. Appendix is not clearly delineated in the absence of oral contrast, but there is no definite large bowel inflammation. No bowel obstruction. Consider PID and/or Right Side Tubo-ovarian Abscess with secondary inflammation of distal small bowel. Follow-up Transabdominal And Transvaginal Pelvis Ultrasound may be valuable. 3. Negative CT Abdomen. Electronically Signed   By: Genevie Ann M.D.   On: 03/27/2022 06:01    Pending Labs Unresulted Labs (From admission, onward)     Start     Ordered   03/28/22 7893  Basic metabolic panel  Tomorrow morning,   R        03/27/22 1905   03/28/22 0500  CBC with Differential/Platelet  Tomorrow morning,   R        03/27/22 1905   03/27/22 0649  Blood Culture (routine x 2)  (Undifferentiated presentation (screening labs and basic nursing orders))  BLOOD CULTURE X 2,   STAT      03/27/22 0649   03/27/22 0649  Urine Culture  (Undifferentiated presentation (screening labs and basic nursing orders))  ONCE - URGENT,   URGENT       Question:  Indication  Answer:  Sepsis   03/27/22 0649            Vitals/Pain Today's Vitals   03/27/22 2059 03/27/22 2150 03/27/22 2200 03/27/22 2215  BP:   117/69   Pulse:  94 (!) 101  Resp:   (!) 27 18  Temp:  99.2 F (37.3 C)    TempSrc:  Oral    SpO2:   99% 99%  Weight:      Height:      PainSc: 5        Isolation Precautions No active isolations  Medications Medications  doxycycline (VIBRAMYCIN) 100 mg in sodium chloride 0.9 % 250 mL IVPB (0 mg Intravenous Stopped 03/27/22 2046)  metroNIDAZOLE (FLAGYL) IVPB 500 mg (0 mg Intravenous Stopped 03/27/22 1927)  cefTRIAXone (ROCEPHIN) 2 g in sodium chloride 0.9 % 100 mL IVPB (has no administration in time range)  prenatal multivitamin tablet 1 tablet (has no administration in time range)  enoxaparin (LOVENOX) injection 40 mg (has no administration in time range)  oxyCODONE-acetaminophen (PERCOCET/ROXICET) 5-325 MG per tablet 1-2 tablet (1 tablet Oral Given 03/27/22  1926)  HYDROmorphone (DILAUDID) injection 0.2-0.6 mg (has no administration in time range)  senna-docusate (Senokot-S) tablet 1 tablet (has no administration in time range)  bisacodyl (DULCOLAX) suppository 10 mg (has no administration in time range)  ondansetron (ZOFRAN) tablet 4 mg (has no administration in time range)    Or  ondansetron (ZOFRAN) injection 4 mg (has no administration in time range)  fentaNYL (SUBLIMAZE) injection 50 mcg (50 mcg Intravenous Given 03/27/22 0510)  iohexol (OMNIPAQUE) 350 MG/ML injection 75 mL (75 mLs Intravenous Contrast Given 03/27/22 0537)  lactated ringers bolus 1,000 mL (0 mLs Intravenous Stopped 03/27/22 0854)  morphine (PF) 4 MG/ML injection 4 mg (4 mg Intravenous Given 03/27/22 0731)  ondansetron (ZOFRAN) injection 4 mg (4 mg Intravenous Given 03/27/22 0733)  morphine (PF) 4 MG/ML injection 4 mg (4 mg Intravenous Given 03/27/22 0934)  lactated ringers bolus 1,000 mL (0 mLs Intravenous Stopped 03/27/22 1303)    And  lactated ringers bolus 1,000 mL (0 mLs Intravenous Stopped 03/27/22 1101)  morphine (PF) 4 MG/ML injection 4 mg (4 mg Intravenous Given 03/27/22 1456)  cefTRIAXone (ROCEPHIN) 1 g in sodium chloride 0.9 % 100 mL IVPB (0 g Intravenous Stopped 03/27/22 2046)    Mobility walks Low fall risk   Focused Assessments Cardiac Assessment Handoff:    Lab Results  Component Value Date   TROPONINI <0.03 06/15/2017   Lab Results  Component Value Date   DDIMER <0.27 06/15/2017   Does the Patient currently have chest pain? No   , Pulmonary Assessment Handoff:  Lung sounds:   O2 Device: Room Air      R Recommendations: See Admitting Provider Note  Report given to:   Additional Notes:

## 2022-03-27 NOTE — ED Triage Notes (Signed)
Pt here for abd pain x5 days. Pt described pain as stabbing and is in lower abd. Pt reports some vaginal bleeding this morning. Pt thinks she may be pregnant, test from yesterday was negative. 132/80, 98HR, cbg 121

## 2022-03-27 NOTE — ED Notes (Signed)
Patient transported to Ultrasound 

## 2022-03-27 NOTE — ED Provider Triage Note (Signed)
  Emergency Medicine Provider Triage Evaluation Note  MRN:  [id number]  Arrival date & time: 03/27/22    Medically screening exam initiated at 4:58 AM.   CC:   Abdominal pain  HPI:  Rebecca Roy is a 21 y.o. year-old female presents to the ED with chief complaint of RLQ pain x 5 days.  Worse with movement and walking.  Came yesterday, but wasn't able to wait for a room.  Was seen at urgent care 2 days ago.  Reports having had some vaginal bleeding.  History provided by patient. ROS:  -As included in HPI PE:   Vitals:   03/27/22 0453  BP: 93/82  Pulse: (!) 114  Resp: 17  Temp: 98.4 F (36.9 C)  SpO2: 99%    uncomfortable appearing No respiratory distress MDM:  Undifferentiated abdominal pain I've ordered labs and imaging in triage to expedite lab/diagnostic workup.  Patient was informed that the remainder of the evaluation will be completed by another provider, this initial triage assessment does not replace that evaluation, and the importance of remaining in the ED until their evaluation is complete.    Montine Circle, PA-C 03/27/22 0500

## 2022-03-27 NOTE — Sepsis Progress Note (Signed)
Code Sepsis protocol being monitored be eLink

## 2022-03-27 NOTE — H&P (Signed)
Preoperative History and Physical  Rebecca Roy is a 21 y.o. G0P0000 with Patient's last menstrual period was 03/07/2022. admitted for a antibiotic therapy for right pelvic TOA/phlegmon, appenidx looks clear but not completely visualized, likely not primary etiology but will keep in differential if patient does not respond to therapy.  Pt has been hurting for 3 days, with significant increase today WBC 22.6(87% neutrophils) History of PID treament x 2 in the past year, negative gonorrhea chlamydia each bof those  times  PMH:    Past Medical History:  Diagnosis Date   Gonorrhea     PSH:    No past surgical history on file.  POb/GynH:      OB History     Gravida  0   Para  0   Term  0   Preterm  0   AB  0   Living  0      SAB  0   IAB  0   Ectopic  0   Multiple  0   Live Births  0           SH:   Social History   Tobacco Use   Smoking status: Never    Passive exposure: Yes   Smokeless tobacco: Never  Vaping Use   Vaping Use: Every day   Substances: Nicotine, Flavoring  Substance Use Topics   Alcohol use: No   Drug use: Yes    Types: Marijuana    Comment: everyday    FH:    Family History  Problem Relation Age of Onset   Heart disease Maternal Grandmother    Diabetes Maternal Grandmother    Hypertension Maternal Grandmother    Other Maternal Grandfather        blood clot in lung   Heart disease Maternal Aunt    Diabetes Maternal Aunt    Hypertension Maternal Aunt      Allergies: No Known Allergies  Medications:       Current Facility-Administered Medications:    cefTRIAXone (ROCEPHIN) 1 g in sodium chloride 0.9 % 100 mL IVPB, 1 g, Intravenous, Q24H, McCauley, Larry B, PA-C, Stopped at 03/27/22 0854   doxycycline (VIBRAMYCIN) 100 mg in sodium chloride 0.9 % 250 mL IVPB, 100 mg, Intravenous, Q12H, McCauley, Larry B, PA-C, Stopped at 03/27/22 1138   lactated ringers infusion, , Intravenous, Continuous, Dorothyann Peng, PA-C, Last  Rate: 150 mL/hr at 03/27/22 1036, New Bag at 03/27/22 1036   metroNIDAZOLE (FLAGYL) IVPB 500 mg, 500 mg, Intravenous, Q12H, Dorothyann Peng, PA-C, Stopped at 03/27/22 1002  Current Outpatient Medications:    acetaminophen (TYLENOL) 325 MG tablet, Take 2 tablets (650 mg total) by mouth every 6 (six) hours as needed for mild pain, moderate pain or fever., Disp: 30 tablet, Rfl: 0   lactulose (CHRONULAC) 10 GM/15ML solution, Take 15 mLs (10 g total) by mouth daily as needed for mild constipation., Disp: 236 mL, Rfl: 0   cephALEXin (KEFLEX) 500 MG capsule, Take 1 capsule (500 mg total) by mouth 3 (three) times daily. (Patient not taking: Reported on 03/27/2022), Disp: 21 capsule, Rfl: 0  Review of Systems:   Review of Systems  Constitutional: Negative for fever, chills, weight loss, malaise/fatigue and diaphoresis.  HENT: Negative for hearing loss, ear pain, nosebleeds, congestion, sore throat, neck pain, tinnitus and ear discharge.   Eyes: Negative for blurred vision, double vision, photophobia, pain, discharge and redness.  Respiratory: Negative for cough, hemoptysis, sputum production, shortness of breath, wheezing and stridor.  Cardiovascular: Negative for chest pain, palpitations, orthopnea, claudication, leg swelling and PND.  Gastrointestinal: Positive for abdominal pain. Negative for heartburn, nausea, vomiting, diarrhea, constipation, blood in stool and melena.  Genitourinary: Negative for dysuria, urgency, frequency, hematuria and flank pain.  Musculoskeletal: Negative for myalgias, back pain, joint pain and falls.  Skin: Negative for itching and rash.  Neurological: Negative for dizziness, tingling, tremors, sensory change, speech change, focal weakness, seizures, loss of consciousness, weakness and headaches.  Endo/Heme/Allergies: Negative for environmental allergies and polydipsia. Does not bruise/bleed easily.  Psychiatric/Behavioral: Negative for depression, suicidal ideas,  hallucinations, memory loss and substance abuse. The patient is not nervous/anxious and does not have insomnia.      PHYSICAL EXAM:  Blood pressure 118/74, pulse 86, temperature 98.5 F (36.9 C), temperature source Oral, resp. rate 14, last menstrual period 03/07/2022, SpO2 100 %.    Vitals reviewed. Constitutional: She is oriented to person, place, and time. She appears well-developed and well-nourished.  HENT:  Head: Normocephalic and atraumatic.  Right Ear: External ear normal.  Left Ear: External ear normal.  Nose: Nose normal.  Mouth/Throat: Oropharynx is clear and moist.  Eyes: Conjunctivae and EOM are normal. Pupils are equal, round, and reactive to light. Right eye exhibits no discharge. Left eye exhibits no discharge. No scleral icterus.  Neck: Normal range of motion. Neck supple. No tracheal deviation present. No thyromegaly present.  Cardiovascular: Normal rate, regular rhythm, normal heart sounds and intact distal pulses.  Exam reveals no gallop and no friction rub.   No murmur heard. Respiratory: Effort normal and breath sounds normal. No respiratory distress. She has no wheezes. She has no rales. She exhibits no tenderness.  GI: Soft. Bowel sounds are normal. She exhibits no distension and no mass. There is tenderness. There is no rebound and no guarding.  Genitourinary:       per sonogram Musculoskeletal: Normal range of motion. She exhibits no edema and no tenderness.  Neurological: She is alert and oriented to person, place, and time. She has normal reflexes. She displays normal reflexes. No cranial nerve deficit. She exhibits normal muscle tone. Coordination normal.  Skin: Skin is warm and dry. No rash noted. No erythema. No pallor.  Psychiatric: She has a normal mood and affect. Her behavior is normal. Judgment and thought content normal.    Labs: Results for orders placed or performed during the hospital encounter of 03/27/22 (from the past 336 hour(s))   Comprehensive metabolic panel   Collection Time: 03/27/22  4:57 AM  Result Value Ref Range   Sodium 136 135 - 145 mmol/L   Potassium 4.1 3.5 - 5.1 mmol/L   Chloride 105 98 - 111 mmol/L   CO2 19 (L) 22 - 32 mmol/L   Glucose, Bld 113 (H) 70 - 99 mg/dL   BUN 11 6 - 20 mg/dL   Creatinine, Ser 0.99 0.44 - 1.00 mg/dL   Calcium 9.0 8.9 - 10.3 mg/dL   Total Protein 7.6 6.5 - 8.1 g/dL   Albumin 3.7 3.5 - 5.0 g/dL   AST 17 15 - 41 U/L   ALT 11 0 - 44 U/L   Alkaline Phosphatase 63 38 - 126 U/L   Total Bilirubin 1.3 (H) 0.3 - 1.2 mg/dL   GFR, Estimated >60 >60 mL/min   Anion gap 12 5 - 15  Lipase, blood   Collection Time: 03/27/22  4:57 AM  Result Value Ref Range   Lipase 26 11 - 51 U/L  CBC with Diff  Collection Time: 03/27/22  4:57 AM  Result Value Ref Range   WBC 22.6 (H) 4.0 - 10.5 K/uL   RBC 4.16 3.87 - 5.11 MIL/uL   Hemoglobin 12.6 12.0 - 15.0 g/dL   HCT 37.5 36.0 - 46.0 %   MCV 90.1 80.0 - 100.0 fL   MCH 30.3 26.0 - 34.0 pg   MCHC 33.6 30.0 - 36.0 g/dL   RDW 13.1 11.5 - 15.5 %   Platelets 385 150 - 400 K/uL   nRBC 0.0 0.0 - 0.2 %   Neutrophils Relative % 87 %   Neutro Abs 19.8 (H) 1.7 - 7.7 K/uL   Lymphocytes Relative 7 %   Lymphs Abs 1.5 0.7 - 4.0 K/uL   Monocytes Relative 5 %   Monocytes Absolute 1.1 (H) 0.1 - 1.0 K/uL   Eosinophils Relative 0 %   Eosinophils Absolute 0.0 0.0 - 0.5 K/uL   Basophils Relative 0 %   Basophils Absolute 0.0 0.0 - 0.1 K/uL   Immature Granulocytes 1 %   Abs Immature Granulocytes 0.17 (H) 0.00 - 0.07 K/uL  Urinalysis, Routine w reflex microscopic   Collection Time: 03/27/22  4:58 AM  Result Value Ref Range   Color, Urine AMBER (A) YELLOW   APPearance CLOUDY (A) CLEAR   Specific Gravity, Urine 1.027 1.005 - 1.030   pH 5.0 5.0 - 8.0   Glucose, UA NEGATIVE NEGATIVE mg/dL   Hgb urine dipstick SMALL (A) NEGATIVE   Bilirubin Urine NEGATIVE NEGATIVE   Ketones, ur NEGATIVE NEGATIVE mg/dL   Protein, ur 30 (A) NEGATIVE mg/dL   Nitrite  NEGATIVE NEGATIVE   Leukocytes,Ua MODERATE (A) NEGATIVE   RBC / HPF 11-20 0 - 5 RBC/hpf   WBC, UA 21-50 0 - 5 WBC/hpf   Bacteria, UA FEW (A) NONE SEEN   Squamous Epithelial / LPF 0-5 0 - 5   Mucus PRESENT   I-Stat beta hCG blood, ED   Collection Time: 03/27/22  5:02 AM  Result Value Ref Range   I-stat hCG, quantitative <5.0 <5 mIU/mL   Comment 3          Lactic acid, plasma   Collection Time: 03/27/22  7:54 AM  Result Value Ref Range   Lactic Acid, Venous 1.1 0.5 - 1.9 mmol/L  Protime-INR   Collection Time: 03/27/22  7:54 AM  Result Value Ref Range   Prothrombin Time 14.8 11.4 - 15.2 seconds   INR 1.2 0.8 - 1.2  APTT   Collection Time: 03/27/22  7:54 AM  Result Value Ref Range   aPTT 30 24 - 36 seconds  Wet prep, genital   Collection Time: 03/27/22  9:28 AM  Result Value Ref Range   Yeast Wet Prep HPF POC NONE SEEN NONE SEEN   Trich, Wet Prep PRESENT (A) NONE SEEN   Clue Cells Wet Prep HPF POC NONE SEEN NONE SEEN   WBC, Wet Prep HPF POC >=10 (A) <10   Sperm NONE SEEN   Results for orders placed or performed during the hospital encounter of 03/26/22 (from the past 336 hour(s))  Urinalysis, Routine w reflex microscopic   Collection Time: 03/26/22  2:00 PM  Result Value Ref Range   Color, Urine AMBER (A) YELLOW   APPearance CLOUDY (A) CLEAR   Specific Gravity, Urine 1.029 1.005 - 1.030   pH 5.0 5.0 - 8.0   Glucose, UA NEGATIVE NEGATIVE mg/dL   Hgb urine dipstick NEGATIVE NEGATIVE   Bilirubin Urine NEGATIVE NEGATIVE   Ketones, ur  NEGATIVE NEGATIVE mg/dL   Protein, ur 30 (A) NEGATIVE mg/dL   Nitrite NEGATIVE NEGATIVE   Leukocytes,Ua MODERATE (A) NEGATIVE   RBC / HPF 21-50 0 - 5 RBC/hpf   WBC, UA >50 (H) 0 - 5 WBC/hpf   Bacteria, UA RARE (A) NONE SEEN   Squamous Epithelial / LPF 6-10 0 - 5   Mucus PRESENT   CBC with Differential   Collection Time: 03/26/22  2:19 PM  Result Value Ref Range   WBC 14.7 (H) 4.0 - 10.5 K/uL   RBC 4.24 3.87 - 5.11 MIL/uL   Hemoglobin  12.6 12.0 - 15.0 g/dL   HCT 38.8 36.0 - 46.0 %   MCV 91.5 80.0 - 100.0 fL   MCH 29.7 26.0 - 34.0 pg   MCHC 32.5 30.0 - 36.0 g/dL   RDW 13.2 11.5 - 15.5 %   Platelets 398 150 - 400 K/uL   nRBC 0.0 0.0 - 0.2 %   Neutrophils Relative % 76 %   Neutro Abs 11.3 (H) 1.7 - 7.7 K/uL   Lymphocytes Relative 12 %   Lymphs Abs 1.8 0.7 - 4.0 K/uL   Monocytes Relative 10 %   Monocytes Absolute 1.4 (H) 0.1 - 1.0 K/uL   Eosinophils Relative 1 %   Eosinophils Absolute 0.1 0.0 - 0.5 K/uL   Basophils Relative 0 %   Basophils Absolute 0.0 0.0 - 0.1 K/uL   Immature Granulocytes 1 %   Abs Immature Granulocytes 0.07 0.00 - 0.07 K/uL  Comprehensive metabolic panel   Collection Time: 03/26/22  2:19 PM  Result Value Ref Range   Sodium 137 135 - 145 mmol/L   Potassium 4.2 3.5 - 5.1 mmol/L   Chloride 107 98 - 111 mmol/L   CO2 23 22 - 32 mmol/L   Glucose, Bld 108 (H) 70 - 99 mg/dL   BUN 8 6 - 20 mg/dL   Creatinine, Ser 0.81 0.44 - 1.00 mg/dL   Calcium 8.8 (L) 8.9 - 10.3 mg/dL   Total Protein 7.5 6.5 - 8.1 g/dL   Albumin 3.8 3.5 - 5.0 g/dL   AST 16 15 - 41 U/L   ALT 12 0 - 44 U/L   Alkaline Phosphatase 61 38 - 126 U/L   Total Bilirubin 0.6 0.3 - 1.2 mg/dL   GFR, Estimated >60 >60 mL/min   Anion gap 7 5 - 15  Lipase, blood   Collection Time: 03/26/22  2:19 PM  Result Value Ref Range   Lipase 30 11 - 51 U/L  I-Stat beta hCG blood, ED   Collection Time: 03/26/22  3:01 PM  Result Value Ref Range   I-stat hCG, quantitative <5.0 <5 mIU/mL   Comment 3            EKG: Orders placed or performed during the hospital encounter of 03/27/22   ED EKG 12-Lead   ED EKG 12-Lead   EKG 12-Lead   EKG 12-Lead    Imaging Studies: US PELVIC COMPLETE W TRANSVAGINAL AND TORSION R/O  Result Date: 03/27/2022 CLINICAL DATA:  [id number] Pelvic pain [id number] negative urine pregnancy test EXAM: TRANSABDOMINAL AND TRANSVAGINAL ULTRASOUND OF PELVIS DOPPLER ULTRASOUND OF OVARIES TECHNIQUE: Both transabdominal and  transvaginal ultrasound examinations of the pelvis were performed. Transabdominal technique was performed for global imaging of the pelvis including uterus, ovaries, adnexal regions, and pelvic cul-de-sac. It was necessary to proceed with endovaginal exam following the transabdominal exam to visualize the endometrium and ovaries/adnexa. Color and duplex Doppler ultrasound was utilized to  evaluate blood flow to the ovaries. COMPARISON:  Same day CT. FINDINGS: Uterus Measurements: 6.5 x 3.5 x 4.0 cm = volume: 48.0 mL. No fibroids or other mass visualized. Endometrium Thickness: 3.5 mm.  No focal abnormality visualized. Right ovary Measurements: 7.0 x 4.8 x 4.4 cm = volume: 77.4 mL. Enlarged ovary due to the presence of a cystic lesion measuring 2.5 x 2.1 x 2.6 cm. This cystic lesion demonstrates internal heterogeneous debris, fluid-fluid level, and peripheral vascularity. There is adjacent dilated structure which is favored to be a fallopian tube with internal complex fluid. Left ovary Measurements: 4.4 x 2.2 x 2.3 cm = volume: 10.7 mL. The left ovary is unremarkable. There is complex fluid along the left adnexa. Pulsed Doppler evaluation of both ovaries demonstrates normal low-resistance arterial and venous waveforms. Other findings There is moderate volume complex free fluid in the pelvis. IMPRESSION: Findings are highly suspicious for pelvic inflammatory disease with right-sided tubo-ovarian abscess and moderate volume complex free fluid in the pelvis and along both adnexa which may be pus. Ruptured hemorrhagic cyst/hematosalpinx is a possibility but considered less likely given the patient's leukocytosis. Electronically Signed   By: Maurine Simmering M.D.   On: 03/27/2022 09:43   CT ABDOMEN PELVIS W CONTRAST  Result Date: 03/27/2022 CLINICAL DATA:  21 year old female with right lower quadrant pain for 5 days. EXAM: CT ABDOMEN AND PELVIS WITH CONTRAST TECHNIQUE: Multidetector CT imaging of the abdomen and pelvis was  performed using the standard protocol following bolus administration of intravenous contrast. RADIATION DOSE REDUCTION: This exam was performed according to the departmental dose-optimization program which includes automated exposure control, adjustment of the mA and/or kV according to patient size and/or use of iterative reconstruction technique. CONTRAST:  89m OMNIPAQUE IOHEXOL 350 MG/ML SOLN COMPARISON:  Noncontrast CT Abdomen and Pelvis 11/24/2020. FINDINGS: Lower chest: Negative. Hepatobiliary: Negative liver and gallbladder. Pancreas: Negative. Spleen: Negative. Adrenals/Urinary Tract: Normal adrenal glands. Both kidneys are symmetrically excreting contrast at the time of these initial images, with evidence of severe motion artifact on earlier 0530 hours attempt. Relatively little ureteral contrast, but in the pelvis the bladder seems to be completely decompressed (sagittal image 62), and oval hyperdensity at the neck of the bladder is felt to be a small volume of excreted IV contrast. No convincing urinary calculus. Stomach/Bowel: Decompressed rectosigmoid colon. Mild upstream retained gas and stool in the large bowel through the transverse colon. Mostly fluid and some gas in the right colon which is nondilated. Cecum is partially located in the anterior pelvis. Appendix is difficult to delineate, but questionable appendix at the upper limits of normal on coronal image 44. No convincing pericecal inflammation. Fluid-filled upper limits of normal to mildly dilated small bowel loops throughout the pelvis which seem to demonstrate mucosal hyperenhancement (series 3, image 71). But upstream jejunum is decompressed and appears normal. No transition. No free air. No free fluid identified in the abdomen. Stomach and duodenum appear negative. Vascular/Lymphatic: Suboptimal intravascular contrast. Normal caliber abdominal aorta. No calcified atherosclerosis or lymphadenopathy identified. Reproductive: Uterus and left  adnexa appear within normal limits. Right adnexa is asymmetrically enlarged, with a mildly complex fluid density lesion measuring up to 3 cm on series 3, image 78. Questionable right side hydrosalpinx associated. Other: Fluid in the cul-de-sac (series 3, image 78) has complex fluid density and there is some evidence of adjacent peritoneal thickening and enhancement (series 3, image 78). However, no well delineated abscess. Musculoskeletal: Levoconvex scoliosis. Straightening of lumbar lordosis and lower thoracic kyphosis. No acute  osseous abnormality identified. IMPRESSION: 1. Initially motion degraded exam, repeated with small volume of excreted IV contrast suspected in otherwise decompressed urinary bladder in the pelvis. 2. Abnormal Pelvis, with small volume of complex free fluid and possible peritoneal thickening in the cul-de-sac (raising the possibility of infected fluid collection), asymmetric right adnexa with questionable Hydrosalpinx, and mildly inflamed appearing distal small bowel loops. Appendix is not clearly delineated in the absence of oral contrast, but there is no definite large bowel inflammation. No bowel obstruction. Consider PID and/or Right Side Tubo-ovarian Abscess with secondary inflammation of distal small bowel. Follow-up Transabdominal And Transvaginal Pelvis Ultrasound may be valuable. 3. Negative CT Abdomen. Electronically Signed   By: Genevie Ann M.D.   On: 03/27/2022 06:01   DG Abd 1 View  Result Date: 03/25/2022 CLINICAL DATA:  Constipation EXAM: ABDOMEN - 1 VIEW COMPARISON:  None Available. FINDINGS: The bowel gas pattern is normal. No radio-opaque calculi or other significant radiographic abnormality are seen. Mild levocurvature of the lumbar spine. IMPRESSION: Normal bowel-gas pattern. Electronically Signed   By: Yetta Glassman M.D.   On: 03/25/2022 10:42      Assessment: PID with pelvic phlegmon/TOA 2.6 cm complex too small for IR consideration, should respond to  antibiotics  Plan: Rocephin + Doxycycline + Flagyl IV Await Gen probe results  Florian Buff 03/27/2022 3:48 PM

## 2022-03-28 DIAGNOSIS — N7093 Salpingitis and oophoritis, unspecified: Secondary | ICD-10-CM | POA: Diagnosis not present

## 2022-03-28 LAB — BASIC METABOLIC PANEL
Anion gap: 9 (ref 5–15)
BUN: 5 mg/dL — ABNORMAL LOW (ref 6–20)
CO2: 21 mmol/L — ABNORMAL LOW (ref 22–32)
Calcium: 8.2 mg/dL — ABNORMAL LOW (ref 8.9–10.3)
Chloride: 102 mmol/L (ref 98–111)
Creatinine, Ser: 0.8 mg/dL (ref 0.44–1.00)
GFR, Estimated: >60 mL/min (ref >60)
Glucose, Bld: 92 mg/dL (ref 70–99)
Potassium: 3.8 mmol/L (ref 3.5–5.1)
Sodium: 132 mmol/L — ABNORMAL LOW (ref 135–145)

## 2022-03-28 LAB — GC/CHLAMYDIA PROBE AMP (~~LOC~~) NOT AT ARMC
Chlamydia: NEGATIVE
Comment: NEGATIVE
Comment: NORMAL
Neisseria Gonorrhea: NEGATIVE

## 2022-03-28 LAB — CBC WITH DIFFERENTIAL/PLATELET
Abs Immature Granulocytes: 0.06 10*3/uL (ref 0.00–0.07)
Basophils Absolute: 0 10*3/uL (ref 0.0–0.1)
Basophils Relative: 0 %
Eosinophils Absolute: 0 10*3/uL (ref 0.0–0.5)
Eosinophils Relative: 0 %
HCT: 31 % — ABNORMAL LOW (ref 36.0–46.0)
Hemoglobin: 10.6 g/dL — ABNORMAL LOW (ref 12.0–15.0)
Immature Granulocytes: 0 %
Lymphocytes Relative: 11 %
Lymphs Abs: 1.8 10*3/uL (ref 0.7–4.0)
MCH: 30.5 pg (ref 26.0–34.0)
MCHC: 34.2 g/dL (ref 30.0–36.0)
MCV: 89.3 fL (ref 80.0–100.0)
Monocytes Absolute: 1.4 10*3/uL — ABNORMAL HIGH (ref 0.1–1.0)
Monocytes Relative: 8 %
Neutro Abs: 13.8 10*3/uL — ABNORMAL HIGH (ref 1.7–7.7)
Neutrophils Relative %: 81 %
Platelets: 296 10*3/uL (ref 150–400)
RBC: 3.47 MIL/uL — ABNORMAL LOW (ref 3.87–5.11)
RDW: 12.7 % (ref 11.5–15.5)
WBC: 17.1 10*3/uL — ABNORMAL HIGH (ref 4.0–10.5)
nRBC: 0 % (ref 0.0–0.2)

## 2022-03-28 MED ORDER — FENTANYL CITRATE PF 50 MCG/ML IJ SOSY
50.0000 ug | PREFILLED_SYRINGE | INTRAMUSCULAR | Status: DC | PRN
  Administered 2022-03-28: 50 ug via INTRAVENOUS
  Administered 2022-03-28 – 2022-03-29 (×7): 100 ug via INTRAVENOUS
  Filled 2022-03-28: qty 1
  Filled 2022-03-28 (×2): qty 2
  Filled 2022-03-28: qty 1
  Filled 2022-03-28: qty 2
  Filled 2022-03-28: qty 1
  Filled 2022-03-28 (×3): qty 2

## 2022-03-28 MED ORDER — BISACODYL 10 MG RE SUPP: 10.0000 mg | Freq: Every day | RECTAL | Status: DC | PRN

## 2022-03-28 MED ORDER — SODIUM CHLORIDE 0.9 % IV SOLN: INTRAVENOUS | Status: DC | PRN

## 2022-03-28 NOTE — Progress Notes (Signed)
Patient had one episode of bright yellow vomiting into the trash can. She thinks that it coincides with the percocet.

## 2022-03-28 NOTE — Progress Notes (Signed)
Patient ID: Rebecca Roy, female   DOB: March 16, 2001, 21 y.o.   MRN: [id number]   Rebecca Roy is a 21 y.o. female patient. G0P0000 Patient's last menstrual period was 03/07/2022.     Past Medical History:  Diagnosis Date   Gonorrhea     No past surgical history pertinent negatives on file.  Scheduled Meds:  enoxaparin (LOVENOX) injection  40 mg Subcutaneous Q24H   prenatal multivitamin  1 tablet Oral Q1200    Continuous Infusions:  sodium chloride 5 mL/hr at 03/28/22 0757   cefTRIAXone (ROCEPHIN)  IV 2 g (03/28/22 0759)   doxycycline (VIBRAMYCIN) IV 100 mg (03/28/22 0811)   metronidazole Stopped (03/28/22 0648)    PRN Meds:sodium chloride, bisacodyl, bisacodyl, fentaNYL (SUBLIMAZE) injection, ondansetron **OR** ondansetron (ZOFRAN) IV, oxyCODONE-acetaminophen, senna-docusate  No Known Allergies  Principal Problem:   Right tubo-ovarian abscess   Subjective   Pt qith pain in RLQ, a little better No NVD  Objective   Vitals:   03/28/22 0312 03/28/22 0742 03/28/22 0953 03/28/22 1119  BP: 120/68 (!) 120/59 (!) 128/51 127/69  Pulse: (!) 105 (!) 115 (!) 121 (!) 106  Resp:  '18 17 17  '$ Temp: 99.8 F (37.7 C) (!) 100.8 F (38.2 C) 100.2 F (37.9 C) (!) 100.4 F (38 C)  TempSrc: Oral   Oral  SpO2: 100% 95% 98% 95%  Weight:      Height:       Vitals:   03/27/22 1430 03/27/22 1530 03/27/22 1545 03/27/22 1900  BP: 120/72 114/71 122/67 113/60   03/27/22 1915 03/27/22 2000 03/27/22 2200 03/27/22 2339  BP: 118/62 118/64 117/69 130/79   03/28/22 0312 03/28/22 0742 03/28/22 0953 03/28/22 1119  BP: 120/68 (!) 120/59 (!) 128/51 127/69     Subjective Objective: Vital signs (most recent): Blood pressure 127/69, pulse (!) 106, temperature (!) 100.4 F (38 C), temperature source Oral, resp. rate 17, height '5\' 3"'$  (1.6 m), weight 59.1 kg, last menstrual period 03/07/2022, SpO2 95 %.   Gen  WDWN NAD Abdomen  soft moderate tender RLQ no rebound Incision  na     Latest  Ref Rng & Units 03/28/2022    6:03 AM 03/27/2022    4:57 AM 03/26/2022    2:19 PM  CBC  WBC 4.0 - 10.5 K/uL 17.1  22.6  14.7   Hemoglobin 12.0 - 15.0 g/dL 10.6  12.6  12.6   Hematocrit 36.0 - 46.0 % 31.0  37.5  38.8   Platelets 150 - 400 K/uL 296  385  398        Latest Ref Rng & Units 03/28/2022    6:03 AM 03/27/2022    4:57 AM 03/26/2022    2:19 PM  CMP  Glucose 70 - 99 mg/dL 92  113  108   BUN 6 - 20 mg/dL '5  11  8   '$ Creatinine 0.44 - 1.00 mg/dL 0.80  0.99  0.81   Sodium 135 - 145 mmol/L 132  136  137   Potassium 3.5 - 5.1 mmol/L 3.8  4.1  4.2   Chloride 98 - 111 mmol/L 102  105  107   CO2 22 - 32 mmol/L '21  19  23   '$ Calcium 8.9 - 10.3 mg/dL 8.2  9.0  8.8   Total Protein 6.5 - 8.1 g/dL  7.6  7.5   Total Bilirubin 0.3 - 1.2 mg/dL  1.3  0.6   Alkaline Phos 38 - 126 U/L  63  61   AST  15 - 41 U/L  17  16   ALT 0 - 44 U/L  11  12      Assessment & Plan Right TOA: >continue rocephin/doxycycline/flagyl >daily WBC >recheck sonogram if does not respond in 72 hours  Florian Buff, MD 03/28/2022, 1:07 PM

## 2022-03-29 ENCOUNTER — Ambulatory Visit: Payer: Medicaid Other | Admitting: Family Medicine

## 2022-03-29 ENCOUNTER — Telehealth: Payer: Self-pay | Admitting: Family Medicine

## 2022-03-29 DIAGNOSIS — N7093 Salpingitis and oophoritis, unspecified: Secondary | ICD-10-CM | POA: Diagnosis not present

## 2022-03-29 LAB — CBC WITH DIFFERENTIAL/PLATELET
Abs Immature Granulocytes: 0.09 10*3/uL — ABNORMAL HIGH (ref 0.00–0.07)
Basophils Absolute: 0 10*3/uL (ref 0.0–0.1)
Basophils Relative: 0 %
Eosinophils Absolute: 0 10*3/uL (ref 0.0–0.5)
Eosinophils Relative: 0 %
HCT: 31.4 % — ABNORMAL LOW (ref 36.0–46.0)
Hemoglobin: 10.6 g/dL — ABNORMAL LOW (ref 12.0–15.0)
Immature Granulocytes: 1 %
Lymphocytes Relative: 8 %
Lymphs Abs: 1.4 10*3/uL (ref 0.7–4.0)
MCH: 30.3 pg (ref 26.0–34.0)
MCHC: 33.8 g/dL (ref 30.0–36.0)
MCV: 89.7 fL (ref 80.0–100.0)
Monocytes Absolute: 1.6 10*3/uL — ABNORMAL HIGH (ref 0.1–1.0)
Monocytes Relative: 8 %
Neutro Abs: 15.4 10*3/uL — ABNORMAL HIGH (ref 1.7–7.7)
Neutrophils Relative %: 83 %
Platelets: 315 10*3/uL (ref 150–400)
RBC: 3.5 MIL/uL — ABNORMAL LOW (ref 3.87–5.11)
RDW: 12.9 % (ref 11.5–15.5)
WBC: 18.6 10*3/uL — ABNORMAL HIGH (ref 4.0–10.5)
nRBC: 0 % (ref 0.0–0.2)

## 2022-03-29 MED ORDER — PIPERACILLIN-TAZOBACTAM 3.375 G IVPB
3.3750 g | Freq: Three times a day (TID) | INTRAVENOUS | Status: DC
  Administered 2022-03-29 – 2022-03-30 (×4): 3.375 g via INTRAVENOUS
  Filled 2022-03-29 (×8): qty 50

## 2022-03-29 MED ORDER — HYDROMORPHONE HCL 2 MG PO TABS
2.0000 mg | ORAL_TABLET | Freq: Four times a day (QID) | ORAL | Status: DC | PRN
  Administered 2022-03-29: 2 mg via ORAL
  Administered 2022-03-29: 4 mg via ORAL
  Administered 2022-03-29: 2 mg via ORAL
  Administered 2022-03-30 (×2): 4 mg via ORAL
  Filled 2022-03-29 (×3): qty 1
  Filled 2022-03-29 (×2): qty 2
  Filled 2022-03-29: qty 1
  Filled 2022-03-29: qty 2

## 2022-03-29 MED ORDER — KETOROLAC TROMETHAMINE 30 MG/ML IJ SOLN
30.0000 mg | Freq: Three times a day (TID) | INTRAMUSCULAR | Status: AC
  Administered 2022-03-29 – 2022-03-31 (×6): 30 mg via INTRAVENOUS
  Filled 2022-03-29 (×6): qty 1

## 2022-03-29 NOTE — Telephone Encounter (Signed)
10.13.23 no show letter sent

## 2022-03-29 NOTE — Progress Notes (Signed)
Patient ID: Rebecca Rebecca, female   DOB: [birth year], 21 y.o.   MRN: [id number]   Rebecca Rebecca is a 21 y.o. female patient. G0P0000 Patient's last menstrual period was 03/07/2022.     Past Medical History:  Diagnosis Date   Gonorrhea     No past surgical history pertinent negatives on file.  Scheduled Meds:  enoxaparin (LOVENOX) injection  40 mg Subcutaneous Q24H   ketorolac  30 mg Intravenous Q8H   prenatal multivitamin  1 tablet Oral Q1200    Continuous Infusions:  sodium chloride Stopped (03/28/22 1057)   doxycycline (VIBRAMYCIN) IV 100 mg (03/29/22 0851)   piperacillin-tazobactam (ZOSYN)  IV      PRN Meds:sodium chloride, bisacodyl, HYDROmorphone, ondansetron **OR** ondansetron (ZOFRAN) IV, senna-docusate  No Known Allergies  Principal Problem:   Right tubo-ovarian abscess   Subjective    Pain is persistent, not improved Some nausea esp with the percocet, stopped-->dilaudid Add toradol for pain management  Objective   Vitals:   03/29/22 0023 03/29/22 0400 03/29/22 0432 03/29/22 0557  BP:  (!) 121/57    Pulse:  (!) 106 95   Resp:  18    Temp: 99.9 F (37.7 C)  (!) 100.8 F (38.2 C) 99.7 F (37.6 C)  TempSrc: Oral Oral Oral Oral  SpO2:  98%    Weight:  56.4 kg    Height:       Date/Time Temp Pulse ECG Heart Rate Resp BP Patient Position (if appropriate) SpO2 O2 Device O2 Flow Rate (L/min) FiO2 (%) Weight Who  03/29/22 0557 99.7 F (37.6 C) -- -- -- -- -- -- -- -- -- -- RA  03/29/22 0432 100.8 F (38.2 C) Abnormal  95 -- -- -- -- -- -- -- -- -- RA  03/29/22 0400 -- 106 Abnormal  -- 18 121/57 Abnormal  Lying 98 % Room Air -- -- -- RA  03/29/22 0400 -- -- -- -- -- -- -- -- -- -- 56.4 kg JN  03/29/22 0023 99.9 F (37.7 C) -- -- -- -- -- -- -- -- -- -- AP  03/29/22 0017 99.9 F (37.7 C) -- -- -- 119/66 Lying 99 % -- -- -- -- AP  03/28/22 2035 98.2 F (36.8 C) 94 -- 20 120/69 Lying 97 % Room Air -- -- -- RA  03/28/22 1608 98 F (36.7 C) 95 91 19  119/72 -- 99 % -- -- -- -- KM  03/28/22 1217 100.2 F (37.9 C) 100 -- 19 131/73 -- 96 % Room Air -- -- -- Rebecca Rebecca  03/28/22 1119 100.4 F (38 C) Abnormal  106 Abnormal  -- 17 127/69 -- 95 % -- -- -- -- KM  03/28/22 0953 100.2 F (37.9 C) 121 Abnormal  -- 17 128/51 Abnormal  -- 98 % Room Air -- -- -- Rebecca Rebecca  03/28/22 0742 100.8 F (38.2 C) Abnormal  -- -- -- -- -- -- Room Air -- -- -- Rebecca Rebecca  03/28/22 0742 -- 115 Abnormal  -- 18 120/59 Abnormal  -- 95 % -- -- -- -- KM  03/28/22 0312 99.8 F (37.7 C) 105 Abnormal  -- -- 120/68 -- 100 % -- -- -- -- MH    Subjective Objective: Vital signs (most recent): Blood pressure (!) 121/57, pulse 95, temperature 99.7 F (37.6 C), temperature source Oral, resp. rate 18, height '5\' 3"'$  (1.6 m), weight 56.4 kg, last menstrual period 03/07/2022, SpO2 98 %.   Gen  WDWN NAD Abdomen  soft +guarding +/- rebound, worse today  on exam Incision  na     Latest Ref Rng & Units 03/29/2022    9:21 AM 03/28/2022    6:03 AM 03/27/2022    4:57 AM  CBC  WBC 4.0 - 10.5 K/uL 18.6  17.1  22.6   Hemoglobin 12.0 - 15.0 g/dL 10.6  10.6  12.6   Hematocrit 36.0 - 46.0 % 31.4  31.0  37.5   Platelets 150 - 400 K/uL 315  296  385        Latest Ref Rng & Units 03/28/2022    6:03 AM 03/27/2022    4:57 AM 03/26/2022    2:19 PM  CMP  Glucose 70 - 99 mg/dL 92  113  108   BUN 6 - 20 mg/dL '5  11  8   '$ Creatinine 0.44 - 1.00 mg/dL 0.80  0.99  0.81   Sodium 135 - 145 mmol/L 132  136  137   Potassium 3.5 - 5.1 mmol/L 3.8  4.1  4.2   Chloride 98 - 111 mmol/L 102  105  107   CO2 22 - 32 mmol/L '21  19  23   '$ Calcium 8.9 - 10.3 mg/dL 8.2  9.0  8.8   Total Protein 6.5 - 8.1 g/dL  7.6  7.5   Total Bilirubin 0.3 - 1.2 mg/dL  1.3  0.6   Alkaline Phos 38 - 126 U/L  63  61   AST 15 - 41 U/L  17  16   ALT 0 - 44 U/L  11  12      Assessment & Plan Right TOA: >worsening WBC and exam not improved, stop rocephin/flagyl, add zosyn, continue doxycycline, Gc/Chlamydia test is negative, not unusual  at this point >daily WBC >recheck sonogram tomorrow if not clinical improvement  Rebecca Buff, MD 03/29/2022, 12:51 PM   Patient ID: Rebecca Rebecca, female   DOB: 2000-12-12, 21 y.o.   MRN: [id number]

## 2022-03-29 NOTE — Plan of Care (Signed)
  Problem: Health Behavior/Discharge Planning: Goal: Ability to manage health-related needs will improve Outcome: Progressing   Problem: Clinical Measurements: Goal: Ability to maintain clinical measurements within normal limits will improve Outcome: Progressing Goal: Will remain free from infection Outcome: Progressing Goal: Diagnostic test results will improve Outcome: Progressing Goal: Respiratory complications will improve Outcome: Progressing Goal: Cardiovascular complication will be avoided Outcome: Progressing   Problem: Activity: Goal: Risk for activity intolerance will decrease Outcome: Progressing   Problem: Nutrition: Goal: Adequate nutrition will be maintained Outcome: Progressing   Problem: Coping: Goal: Level of anxiety will decrease Outcome: Progressing   Problem: Elimination: Goal: Will not experience complications related to bowel motility Outcome: Progressing Goal: Will not experience complications related to urinary retention Outcome: Progressing   Problem: Pain Managment: Goal: General experience of comfort will improve Outcome: Progressing   Problem: Safety: Goal: Ability to remain free from injury will improve Outcome: Progressing   Problem: Skin Integrity: Goal: Risk for impaired skin integrity will decrease Outcome: Progressing   Problem: Fluid Volume: Goal: Hemodynamic stability will improve Outcome: Progressing   Problem: Clinical Measurements: Goal: Diagnostic test results will improve Outcome: Progressing Goal: Signs and symptoms of infection will decrease Outcome: Progressing   Problem: Respiratory: Goal: Ability to maintain adequate ventilation will improve Outcome: Progressing

## 2022-03-29 NOTE — TOC Progression Note (Signed)
Transition of Care [facility]) - Progression Note    Patient Details  Name: Rebecca Roy MRN: [id number] Date of Birth: 2000-12-10  [facility]) CM/SW Contact  Zenon Mayo, RN Phone Number: 03/29/2022, 6:57 PM  Clinical Narrative:    From home , presents with  Right tubo-ovarian abscess, conts on IV abx. TOC following.        Expected Discharge Plan and Services                                                 Social Determinants of Health (SDOH) Interventions    Readmission Risk Interventions     No data to display

## 2022-03-30 DIAGNOSIS — N7093 Salpingitis and oophoritis, unspecified: Secondary | ICD-10-CM | POA: Diagnosis not present

## 2022-03-30 LAB — CBC WITH DIFFERENTIAL/PLATELET
Abs Immature Granulocytes: 0.05 10*3/uL (ref 0.00–0.07)
Basophils Absolute: 0 10*3/uL (ref 0.0–0.1)
Basophils Relative: 0 %
Eosinophils Absolute: 0.2 10*3/uL (ref 0.0–0.5)
Eosinophils Relative: 2 %
HCT: 30.1 % — ABNORMAL LOW (ref 36.0–46.0)
Hemoglobin: 10.2 g/dL — ABNORMAL LOW (ref 12.0–15.0)
Immature Granulocytes: 1 %
Lymphocytes Relative: 14 %
Lymphs Abs: 1.6 10*3/uL (ref 0.7–4.0)
MCH: 30.3 pg (ref 26.0–34.0)
MCHC: 33.9 g/dL (ref 30.0–36.0)
MCV: 89.3 fL (ref 80.0–100.0)
Monocytes Absolute: 1.1 10*3/uL — ABNORMAL HIGH (ref 0.1–1.0)
Monocytes Relative: 10 %
Neutro Abs: 8.1 10*3/uL — ABNORMAL HIGH (ref 1.7–7.7)
Neutrophils Relative %: 73 %
Platelets: 321 10*3/uL (ref 150–400)
RBC: 3.37 MIL/uL — ABNORMAL LOW (ref 3.87–5.11)
RDW: 12.8 % (ref 11.5–15.5)
WBC: 11 10*3/uL — ABNORMAL HIGH (ref 4.0–10.5)
nRBC: 0 % (ref 0.0–0.2)

## 2022-03-30 NOTE — Progress Notes (Signed)
Patient ID: Rebecca Roy, female   DOB: [birth year], 21 y.o.   MRN: [id number]   HD#4: Right TOA 2.6 cm  Rebecca Roy is a 21 y.o. female patient.   1. Pelvic inflammatory disease (PID)   2. Sepsis without acute organ dysfunction, due to unspecified organism [facility])     Past Medical History:  Diagnosis Date   Gonorrhea     No past surgical history pertinent negatives on file.  Scheduled Meds:  enoxaparin (LOVENOX) injection  40 mg Subcutaneous Q24H   ketorolac  30 mg Intravenous Q8H   prenatal multivitamin  1 tablet Oral Q1200    Continuous Infusions:  sodium chloride Stopped (03/28/22 1057)   doxycycline (VIBRAMYCIN) IV 100 mg (03/30/22 1150)   piperacillin-tazobactam (ZOSYN)  IV Stopped (03/30/22 0600)    PRN Meds:sodium chloride, bisacodyl, HYDROmorphone, ondansetron **OR** ondansetron (ZOFRAN) IV, senna-docusate  No Known Allergies  Principal Problem:   Right tubo-ovarian abscess   Subjective   Pt feels better No emesis Eating ambulatory  Objective   Vitals:   03/29/22 1628 03/29/22 2027 03/30/22 0524 03/30/22 1152  BP: 116/67 110/67 (!) 100/58 114/69  Pulse: 92   79  Resp: '20 20 20 18  '$ Temp: 98.6 F (37 C) 98.2 F (36.8 C) 98.2 F (36.8 C) 97.9 F (36.6 C)  TempSrc: Oral Oral Oral Oral  SpO2: 96% 98% 97% 100%  Weight:   55.5 kg   Height:       Vitals:   03/28/22 0742 03/28/22 0953 03/28/22 1119 03/28/22 1217  BP: (!) 120/59 (!) 128/51 127/69 131/73   03/28/22 1608 03/28/22 2035 03/29/22 0017 03/29/22 0400  BP: 119/72 120/69 119/66 (!) 121/57   03/29/22 1628 03/29/22 2027 03/30/22 0524 03/30/22 1152  BP: 116/67 110/67 (!) 100/58 114/69     Subjective Objective: Vital signs (most recent): Blood pressure 114/69, pulse 79, temperature 97.9 F (36.6 C), temperature source Oral, resp. rate 18, height '5\' 3"'$  (1.6 m), weight 55.5 kg, last menstrual period 03/07/2022, SpO2 100 %.   Gen  thin female NAD Abdomen  soft guarding exam a bit improved  over yesterday Incision       Latest Ref Rng & Units 03/30/2022   12:39 AM 03/29/2022    9:21 AM 03/28/2022    6:03 AM  CBC  WBC 4.0 - 10.5 K/uL 11.0  18.6  17.1   Hemoglobin 12.0 - 15.0 g/dL 10.2  10.6  10.6   Hematocrit 36.0 - 46.0 % 30.1  31.4  31.0   Platelets 150 - 400 K/uL 321  315  296        Latest Ref Rng & Units 03/28/2022    6:03 AM 03/27/2022    4:57 AM 03/26/2022    2:19 PM  CMP  Glucose 70 - 99 mg/dL 92  113  108   BUN 6 - 20 mg/dL '5  11  8   '$ Creatinine 0.44 - 1.00 mg/dL 0.80  0.99  0.81   Sodium 135 - 145 mmol/L 132  136  137   Potassium 3.5 - 5.1 mmol/L 3.8  4.1  4.2   Chloride 98 - 111 mmol/L 102  105  107   CO2 22 - 32 mmol/L '21  19  23   '$ Calcium 8.9 - 10.3 mg/dL 8.2  9.0  8.8   Total Protein 6.5 - 8.1 g/dL  7.6  7.5   Total Bilirubin 0.3 - 1.2 mg/dL  1.3  0.6   Alkaline Phos 38 - 126 U/L  63  61   AST 15 - 41 U/L  17  16   ALT 0 - 44 U/L  11  12      Assessment & Plan Right TOA: improved since started zosyn with big drop in WBC Clinically improving If continues on this trajectory, no repeat imaging necessary, convert to orals on Monday, discharge Tuesday on augmentin/doxycyline to finish 14 day course  Florian Buff, MD 03/30/2022, 12:12 PM

## 2022-03-31 DIAGNOSIS — N7093 Salpingitis and oophoritis, unspecified: Secondary | ICD-10-CM | POA: Diagnosis not present

## 2022-03-31 LAB — URINE CULTURE: Culture: 4000 — AB

## 2022-03-31 LAB — CBC WITH DIFFERENTIAL/PLATELET
Abs Immature Granulocytes: 0 10*3/uL (ref 0.00–0.07)
Basophils Absolute: 0.1 10*3/uL (ref 0.0–0.1)
Basophils Relative: 1 %
Eosinophils Absolute: 0.3 10*3/uL (ref 0.0–0.5)
Eosinophils Relative: 5 %
HCT: 30.6 % — ABNORMAL LOW (ref 36.0–46.0)
Hemoglobin: 10 g/dL — ABNORMAL LOW (ref 12.0–15.0)
Lymphocytes Relative: 16 %
Lymphs Abs: 1 10*3/uL (ref 0.7–4.0)
MCH: 29.6 pg (ref 26.0–34.0)
MCHC: 32.7 g/dL (ref 30.0–36.0)
MCV: 90.5 fL (ref 80.0–100.0)
Monocytes Absolute: 0.3 10*3/uL (ref 0.1–1.0)
Monocytes Relative: 4 %
Neutro Abs: 4.8 10*3/uL (ref 1.7–7.7)
Neutrophils Relative %: 74 %
Platelets: 323 10*3/uL (ref 150–400)
RBC: 3.38 MIL/uL — ABNORMAL LOW (ref 3.87–5.11)
RDW: 13 % (ref 11.5–15.5)
WBC: 6.5 10*3/uL (ref 4.0–10.5)
nRBC: 0 % (ref 0.0–0.2)
nRBC: 0 /100 WBC

## 2022-03-31 MED ORDER — PIPERACILLIN-TAZOBACTAM 3.375 G IVPB
3.3750 g | Freq: Three times a day (TID) | INTRAVENOUS | Status: AC
  Administered 2022-03-31: 3.375 g via INTRAVENOUS
  Filled 2022-03-31: qty 50

## 2022-03-31 MED ORDER — AMOXICILLIN-POT CLAVULANATE 875-125 MG PO TABS
1.0000 | ORAL_TABLET | Freq: Two times a day (BID) | ORAL | Status: DC
  Administered 2022-03-31 – 2022-04-01 (×2): 1 via ORAL
  Filled 2022-03-31 (×2): qty 1

## 2022-03-31 MED ORDER — DOXYCYCLINE HYCLATE 100 MG PO TABS
100.0000 mg | ORAL_TABLET | Freq: Two times a day (BID) | ORAL | Status: DC
  Administered 2022-03-31 – 2022-04-01 (×2): 100 mg via ORAL
  Filled 2022-03-31 (×2): qty 1

## 2022-03-31 MED ORDER — KETOROLAC TROMETHAMINE 10 MG PO TABS
10.0000 mg | ORAL_TABLET | Freq: Three times a day (TID) | ORAL | Status: DC
  Administered 2022-03-31 – 2022-04-01 (×4): 10 mg via ORAL
  Filled 2022-03-31 (×5): qty 1

## 2022-03-31 NOTE — Progress Notes (Signed)
Patient ID: Rebecca Roy, female   DOB: 03/14/2001, 21 y.o.   MRN: [id number]   HD#5: Right TOA 2.6 cm  Rebecca Roy is a 21 y.o. female patient. G0P0000     Past Medical History:  Diagnosis Date   Gonorrhea     No past surgical history pertinent negatives on file.  Scheduled Meds:  amoxicillin-clavulanate  1 tablet Oral Q12H   doxycycline  100 mg Oral Q12H   enoxaparin (LOVENOX) injection  40 mg Subcutaneous Q24H   ketorolac  10 mg Oral Q8H   prenatal multivitamin  1 tablet Oral Q1200    Continuous Infusions:  sodium chloride Stopped (03/28/22 1057)   piperacillin-tazobactam (ZOSYN)  IV      PRN Meds:sodium chloride, bisacodyl, HYDROmorphone, ondansetron **OR** ondansetron (ZOFRAN) IV, senna-docusate  No Known Allergies  Principal Problem:   Right tubo-ovarian abscess   Subjective   Pt feels better, no prn pain med requirements No emesis Eating Ambulatory Moving normally  Objective   Vitals:   03/30/22 2009 03/31/22 0546 03/31/22 0547 03/31/22 0800  BP: (!) 106/57 121/87  105/61  Pulse: 75 (!) 54  61  Resp: '18 16  16  '$ Temp: 98.3 F (36.8 C) 98.3 F (36.8 C)  98.3 F (36.8 C)  TempSrc: Oral Oral  Oral  SpO2: 100% 99%  99%  Weight:   56.1 kg   Height:         Subjective Objective: Vital signs (most recent): Blood pressure 105/61, pulse 61, temperature 98.3 F (36.8 C), temperature source Oral, resp. rate 16, height '5\' 3"'$  (1.6 m), weight 56.1 kg, last menstrual period 03/07/2022, SpO2 99 %.   Gen  thin female NAD Abdomen  soft no guarding normal exam today Incision  n/a     Latest Ref Rng & Units 03/31/2022    1:14 AM 03/30/2022   12:39 AM 03/29/2022    9:21 AM  CBC  WBC 4.0 - 10.5 K/uL 6.5  11.0  18.6   Hemoglobin 12.0 - 15.0 g/dL 10.0  10.2  10.6   Hematocrit 36.0 - 46.0 % 30.6  30.1  31.4   Platelets 150 - 400 K/uL 323  321  315        Latest Ref Rng & Units 03/28/2022    6:03 AM 03/27/2022    4:57 AM 03/26/2022    2:19 PM  CMP   Glucose 70 - 99 mg/dL 92  113  108   BUN 6 - 20 mg/dL '5  11  8   '$ Creatinine 0.44 - 1.00 mg/dL 0.80  0.99  0.81   Sodium 135 - 145 mmol/L 132  136  137   Potassium 3.5 - 5.1 mmol/L 3.8  4.1  4.2   Chloride 98 - 111 mmol/L 102  105  107   CO2 22 - 32 mmol/L '21  19  23   '$ Calcium 8.9 - 10.3 mg/dL 8.2  9.0  8.8   Total Protein 6.5 - 8.1 g/dL  7.6  7.5   Total Bilirubin 0.3 - 1.2 mg/dL  1.3  0.6   Alkaline Phos 38 - 126 U/L  63  61   AST 15 - 41 U/L  17  16   ALT 0 - 44 U/L  11  12      Assessment & Plan Right TOA: improved since started zosyn with big drop in WBC Clinically much improved, convert to orals today Anticipate discharge tomorrow, to finish 2 weeks of antibiotics on oral augmentin/doxycycline  Florian Buff, MD 03/31/2022, 8:48 AM   Patient ID: Rebecca Roy, female   DOB: 01-26-2001, 21 y.o.   MRN: [id number]

## 2022-04-01 DIAGNOSIS — N7093 Salpingitis and oophoritis, unspecified: Secondary | ICD-10-CM | POA: Diagnosis not present

## 2022-04-01 LAB — CULTURE, BLOOD (ROUTINE X 2): Culture: NO GROWTH

## 2022-04-01 LAB — CBC WITH DIFFERENTIAL/PLATELET
Abs Immature Granulocytes: 0.02 10*3/uL (ref 0.00–0.07)
Basophils Absolute: 0 10*3/uL (ref 0.0–0.1)
Basophils Relative: 1 %
Eosinophils Absolute: 0.2 10*3/uL (ref 0.0–0.5)
Eosinophils Relative: 3 %
HCT: 29.9 % — ABNORMAL LOW (ref 36.0–46.0)
Hemoglobin: 9.8 g/dL — ABNORMAL LOW (ref 12.0–15.0)
Immature Granulocytes: 0 %
Lymphocytes Relative: 31 %
Lymphs Abs: 1.9 10*3/uL (ref 0.7–4.0)
MCH: 29.4 pg (ref 26.0–34.0)
MCHC: 32.8 g/dL (ref 30.0–36.0)
MCV: 89.8 fL (ref 80.0–100.0)
Monocytes Absolute: 0.6 10*3/uL (ref 0.1–1.0)
Monocytes Relative: 10 %
Neutro Abs: 3.3 10*3/uL (ref 1.7–7.7)
Neutrophils Relative %: 55 %
Platelets: 430 10*3/uL — ABNORMAL HIGH (ref 150–400)
RBC: 3.33 MIL/uL — ABNORMAL LOW (ref 3.87–5.11)
RDW: 12.8 % (ref 11.5–15.5)
WBC: 6.1 10*3/uL (ref 4.0–10.5)
nRBC: 0 % (ref 0.0–0.2)

## 2022-04-01 MED ORDER — AMOXICILLIN-POT CLAVULANATE 875-125 MG PO TABS: 1.0000 | ORAL_TABLET | Freq: Two times a day (BID) | ORAL | 0 refills | Status: AC

## 2022-04-01 MED ORDER — DOXYCYCLINE HYCLATE 100 MG PO TABS: 100.0000 mg | ORAL_TABLET | Freq: Two times a day (BID) | ORAL | 0 refills | Status: AC

## 2022-04-01 NOTE — Progress Notes (Signed)
   04/01/22 1000  Mobility  Activity Ambulated independently in hallway  Level of Assistance Independent  Assistive Device None  Distance Ambulated (ft) 150 ft  Activity Response Tolerated well  Mobility Referral Yes  $Mobility charge 1 Mobility    Mobility Specialist Progress Note  Received pt in bed having no complaints and agreeable to mobility. Pt was asymptomatic throughout ambulation and returned to room w/o fault. Left in chair w/ call bell in reach and all needs met.  Lucious Groves Mobility Specialist

## 2022-04-01 NOTE — Discharge Summary (Signed)
Physician Discharge Summary  Patient ID: Rebecca Roy MRN: [id number] DOB/AGE: 06/20/2000 20 y.o.  Admit date: 03/27/2022 Discharge date: 04/01/2022  Admission Diagnoses:  Right TOA, Pelvic inflammatory disease  Discharge Diagnoses:  Principal Problem:   Right tubo-ovarian abscess   Discharged Condition: stable  Hospital Course: 20yo G0 admitted due to Right TOA/PID.  She was treated with IV antibiotics and oral pain management.  She noted significant clinical improvement.  Pain resolved, Leukocytosis resolved and she remained afebrile x 48hr.  She was discharged home on oral antibiotics to complete two week course and outpatient follow up.  Consults: None  Significant Diagnostic Studies: labs:     Latest Ref Rng & Units 04/01/2022   12:26 AM 03/31/2022    1:14 AM 03/30/2022   12:39 AM  CBC  WBC 4.0 - 10.5 K/uL 6.1  6.5  11.0   Hemoglobin 12.0 - 15.0 g/dL 9.8  10.0  10.2   Hematocrit 36.0 - 46.0 % 29.9  30.6  30.1   Platelets 150 - 400 K/uL 430  323  321     and Pelvic US 10/11: Findings are highly suspicious for pelvic inflammatory disease with right-sided tubo-ovarian abscess and moderate volume complex free fluid in the pelvis and along both adnexa which may be pus. Ruptured hemorrhagic cyst/hematosalpinx is a possibility but considered less likely given the patient's leukocytosis.    Treatments: IV hydration and antibiotics: Doxy/Flagyl/Rocephin  Discharge Exam: Blood pressure 134/79, pulse (!) 54, temperature 98.2 F (36.8 C), temperature source Oral, resp. rate 18, height '5\' 3"'$  (1.6 m), weight 55.4 kg, last menstrual period 03/07/2022, SpO2 100 %. General appearance: alert, cooperative, and no distress Resp: clear to auscultation bilaterally Cardio: regular rate and rhythm GI: soft and non-tender, no rebound, no guarding Pelvic: deferred Extremities: no edema, no calf tenderness bilaterally Skin: warm and dry  Disposition: Discharge disposition: 01-Home or  Self Care        Allergies as of 04/01/2022   No Known Allergies      Medication List     STOP taking these medications    cephALEXin 500 MG capsule Commonly known as: KEFLEX       TAKE these medications    acetaminophen 325 MG tablet Commonly known as: Tylenol Take 2 tablets (650 mg total) by mouth every 6 (six) hours as needed for mild pain, moderate pain or fever.   amoxicillin-clavulanate 875-125 MG tablet Commonly known as: AUGMENTIN Take 1 tablet by mouth every 12 (twelve) hours for 10 days.   doxycycline 100 MG tablet Commonly known as: VIBRA-TABS Take 1 tablet (100 mg total) by mouth every 12 (twelve) hours for 10 days.   lactulose 10 GM/15ML solution Commonly known as: CHRONULAC Take 15 mLs (10 g total) by mouth daily as needed for mild constipation.        Follow-up [place] [facility]. Schedule an appointment as soon as possible for a visit in 2 week(s).   Specialty: Obstetrics and Gynecology Contact information: [place] [address] [place]. Go on 04/08/2022.   Specialty: Internal Medicine Why: '@10'$ :Max Sane information: [place] [phone]                Signed: Annalee Genta 04/01/2022, 9:51 AM

## 2022-04-01 NOTE — TOC Transition Note (Signed)
Transition of Care [facility]) - CM/SW Discharge Note   Patient Details  Name: Rebecca Roy MRN: [id number] Date of Birth: 04-01-2001  Transition of Care [facility] CM/SW Contact:  Zenon Mayo, RN Phone Number: 04/01/2022, 9:54 AM   Clinical Narrative:    Patient is for dc today, she has no needs, indep.         Patient Goals and CMS Choice        Discharge Placement                       Discharge Plan and Services                                     Social Determinants of Health (SDOH) Interventions     Readmission Risk Interventions     No data to display

## 2022-04-03 ENCOUNTER — Telehealth: Payer: Self-pay | Admitting: Obstetrics & Gynecology

## 2022-04-03 NOTE — Telephone Encounter (Signed)
I called patient. She stated she has moved to [facility] and will be getting a f/u there. mc

## 2022-04-08 ENCOUNTER — Inpatient Hospital Stay: Payer: Self-pay | Admitting: Nurse Practitioner

## 2022-04-09 NOTE — Telephone Encounter (Signed)
Missed new pt visit, dismissed from [place], dismissal mailed.

## 2022-04-15 ENCOUNTER — Encounter: Payer: Self-pay | Admitting: Nurse Practitioner

## 2022-04-15 ENCOUNTER — Ambulatory Visit: Payer: Medicaid Other | Admitting: Nurse Practitioner

## 2022-04-15 VITALS — BP 104/53 | HR 71 | Temp 98.1°F | Ht 63.0 in | Wt 124.4 lb

## 2022-04-15 DIAGNOSIS — Z1159 Encounter for screening for other viral diseases: Secondary | ICD-10-CM | POA: Diagnosis not present

## 2022-04-15 DIAGNOSIS — D649 Anemia, unspecified: Secondary | ICD-10-CM | POA: Diagnosis not present

## 2022-04-15 DIAGNOSIS — Z23 Encounter for immunization: Secondary | ICD-10-CM | POA: Diagnosis not present

## 2022-04-15 DIAGNOSIS — N7093 Salpingitis and oophoritis, unspecified: Secondary | ICD-10-CM | POA: Diagnosis not present

## 2022-04-15 DIAGNOSIS — F1290 Cannabis use, unspecified, uncomplicated: Secondary | ICD-10-CM

## 2022-04-15 DIAGNOSIS — Z09 Encounter for follow-up examination after completed treatment for conditions other than malignant neoplasm: Secondary | ICD-10-CM

## 2022-04-15 DIAGNOSIS — A5901 Trichomonal vulvovaginitis: Secondary | ICD-10-CM

## 2022-04-15 NOTE — Patient Instructions (Addendum)
Center for [employer] at [facility]. Schedule an appointment as soon as possible for a visit in 2 week(s).   Specialty: Obstetrics and Gynecology Contact information: [place] [address] [phone]    Please get your TDAP vaccine at the pharmacy   It is important that you exercise regularly at least 30 minutes 5 times a week as tolerated  Think about what you will eat, plan ahead. Choose " clean, green, fresh or frozen" over canned, processed or packaged foods which are more sugary, salty and fatty. 70 to 75% of food eaten should be vegetables and fruit. Three meals at set times with snacks allowed between meals, but they must be fruit or vegetables. Aim to eat over a 12 hour period , example 7 am to 7 pm, and STOP after  your last meal of the day. Drink water,generally about 64 ounces per day, no other drink is as healthy. Fruit juice is best enjoyed in a healthy way, by EATING the fruit.  Thanks for choosing [facility] we consider it a privelige to serve you.

## 2022-04-15 NOTE — Assessment & Plan Note (Signed)
Patient currently denies fever, chills, nausea, vomiting, has tenderness of right pelvic  area on palpation.  She has completed the full course of doxycycline and Augmentin ordered. Patient is encouraged to call the OB/GYN office and schedule an appointment for follow-up she verbalized understanding. Check CBC.

## 2022-04-15 NOTE — Assessment & Plan Note (Signed)
She received IV metronidazole while in the hospital Patient counseled on safe sexual intercourse to prevent STI She was advised to encourage her sexual partner to also get tested and treated if positive

## 2022-04-15 NOTE — Assessment & Plan Note (Signed)
patient was on admission from October 11 to October 16.  Patient states that she feels much better   Patient states that she had a painful sexual intercourse 7 days ago. she currently denies fever, chills, nausea, vomiting, abdominal pain.  States she has completed course of doxycycline 100 mg twice daily, Augmentin 1 tablet twice daily that was ordered for her at discharge.  She will was referred to OB/GYN but she has not heard from them.  Hospital discharge summary, labs and imaging studies reviewed by me today

## 2022-04-15 NOTE — Assessment & Plan Note (Signed)
Lab Results  Component Value Date   WBC 6.1 04/01/2022   HGB 9.8 (L) 04/01/2022   HCT 29.9 (L) 04/01/2022   MCV 89.8 04/01/2022   PLT 430 (H) 04/01/2022  She currently denies fatigue, abnormal  bleeding Check CBC

## 2022-04-15 NOTE — Assessment & Plan Note (Signed)
Current daily marijuana use Need to avoid smoking marijuana due to risk of respiratory disease and other complications discussed with patient she verbalized understanding.

## 2022-04-15 NOTE — Assessment & Plan Note (Deleted)
Patient educated on CDC recommendation for the influenza vaccine. Verbal consent was obtained from the patient, vaccine administered by nurse, no sign of adverse reactions noted at this time. Patient education on arm soreness and use of tylenol for this patient  was discussed. Patient educated on the signs and symptoms of adverse effect and advise to contact the office if they occur.  

## 2022-04-15 NOTE — Assessment & Plan Note (Signed)
Patient educated on CDC recommendation for the influenza vaccine. Verbal consent was obtained from the patient, vaccine administered by nurse, no sign of adverse reactions noted at this time. Patient education on arm soreness and use of tylenol for this patient  was discussed. Patient educated on the signs and symptoms of adverse effect and advise to contact the office if they occur.  

## 2022-04-15 NOTE — Progress Notes (Signed)
New Patient Office Visit  Subjective:  Patient ID: Rebecca Roy, female    DOB: [birth year]  Age: 21 y.o. MRN: [id number]  CC:  Chief Complaint  Patient presents with   Hospitalization Follow-up    Pt was in the hospital for ovarian cyst. Per pt she is feeling better .    HPI Rebecca Roy is a 21 y.o. female with past medical history of right tubo-ovarian abscess, PID, gonorrhea, who presents for follow-up for hospital admission for pelvic inflammatory disease with right-sided tubo-ovarian abscess.  And to establish care with new provider.  She has no previous PCP.  patient was on admission from October 11 to October 16th 2023  Patient states that she feels much better   Patient states that she had a painful sexual intercourse 7 days ago. she currently denies fever, chills, nausea, vomiting, abdominal pain.  States she has completed course of doxycycline 100 mg twice daily, Augmentin 1 tablet twice daily that was ordered for her at discharge.  She will was referred to OB/GYN but she has not heard from them.     Past Medical History:  Diagnosis Date   Gonorrhea     History reviewed. No pertinent surgical history.  Family History  Problem Relation Age of Onset   Heart disease Mother    Heart disease Father    Heart disease Maternal Aunt    Diabetes Maternal Aunt    Hypertension Maternal Aunt    Heart disease Maternal Grandmother    Diabetes Maternal Grandmother    Hypertension Maternal Grandmother    Other Maternal Grandfather        blood clot in lung   Breast cancer Paternal Grandmother    Cervical cancer Neg Hx     Social History   Socioeconomic History   Marital status: Single    Spouse name: Not on file   Number of children: Not on file   Years of education: Not on file   Highest education level: Not on file  Occupational History   Not on file  Tobacco Use   Smoking status: Never    Passive exposure: Yes   Smokeless tobacco: Never  Vaping Use   Vaping  Use: Every day   Substances: Nicotine, Flavoring  Substance and Sexual Activity   Alcohol use: No   Drug use: Yes    Types: Marijuana    Comment: everyday   Sexual activity: Not Currently    Birth control/protection: None, Condom  Other Topics Concern   Not on file  Social History Narrative   Lives with her best friend.    Social Determinants of Health   Financial Resource Strain: Low Risk  (12/19/2020)   Overall Financial Resource Strain (CARDIA)    Difficulty of Paying Living Expenses: Not very hard  Food Insecurity: Food Insecurity Present (12/19/2020)   Hunger Vital Sign    Worried About Running Out of Food in the Last Year: Sometimes true    Ran Out of Food in the Last Year: Sometimes true  Transportation Needs: No Transportation Needs (12/19/2020)   PRAPARE - Hydrologist (Medical): No    Lack of Transportation (Non-Medical): No  Physical Activity: Sufficiently Active (12/19/2020)   Exercise Vital Sign    Days of Exercise per Week: 2 days    Minutes of Exercise per Session: 80 min  Stress: No Stress Concern Present (12/19/2020)   [place]    Feeling of  Stress : Not at all  Social Connections: Moderately Integrated (12/19/2020)   Social Connection and Isolation Panel [NHANES]    Frequency of Communication with Friends and Family: More than three times a week    Frequency of Social Gatherings with Friends and Family: Three times a week    Attends Religious Services: 1 to 4 times per year    Active Member of Clubs or Organizations: No    Attends Archivist Meetings: 1 to 4 times per year    Marital Status: Never married  Intimate Partner Violence: Not At Risk (12/19/2020)   Humiliation, Afraid, Rape, and Kick questionnaire    Fear of Current or Ex-Partner: No    Emotionally Abused: No    Physically Abused: No    Sexually Abused: No    ROS Review of Systems   Constitutional:  Negative for activity change, appetite change, diaphoresis, fatigue and fever.  HENT: Negative.  Negative for congestion, dental problem and drooling.   Respiratory: Negative.  Negative for choking, chest tightness, shortness of breath and wheezing.   Cardiovascular: Negative.  Negative for chest pain, palpitations and leg swelling.  Gastrointestinal:  Negative for abdominal distention, anal bleeding, blood in stool, constipation, diarrhea, nausea, rectal pain and vomiting.  Endocrine: Negative for polydipsia, polyphagia and polyuria.  Genitourinary:  Positive for dyspareunia and pelvic pain. Negative for dysuria, enuresis, flank pain, frequency, vaginal bleeding and vaginal discharge.  Musculoskeletal: Negative.   Skin: Negative.   Neurological: Negative.  Negative for dizziness, facial asymmetry, light-headedness, numbness and headaches.  Psychiatric/Behavioral: Negative.  Negative for agitation, behavioral problems, confusion and decreased concentration.     Objective:   Today's Vitals: BP (!) 104/53   Pulse 71   Temp 98.1 F (36.7 C)   Ht '5\' 3"'$  (1.6 m)   Wt 124 lb 6.4 oz (56.4 kg)   LMP 03/29/2022   SpO2 100%   BMI 22.04 kg/m   Physical Exam Constitutional:      General: She is not in acute distress.    Appearance: Normal appearance. She is not ill-appearing, toxic-appearing or diaphoretic.  Eyes:     General:        Right eye: No discharge.        Left eye: No discharge.     Extraocular Movements: Extraocular movements intact.     Conjunctiva/sclera: Conjunctivae normal.     Pupils: Pupils are equal, round, and reactive to light.  Cardiovascular:     Rate and Rhythm: Normal rate and regular rhythm.     Pulses: Normal pulses.     Heart sounds: Normal heart sounds. No murmur heard.    No friction rub. No gallop.  Pulmonary:     Effort: Pulmonary effort is normal. No respiratory distress.     Breath sounds: Normal breath sounds. No stridor. No  wheezing, rhonchi or rales.  Chest:     Chest wall: No tenderness.  Abdominal:     General: There is no distension.     Palpations: Abdomen is soft. There is no mass.     Tenderness: There is no right CVA tenderness, left CVA tenderness, guarding or rebound.     Hernia: No hernia is present.     Comments: Right pelvic tenderness on palpation   Musculoskeletal:        General: No swelling, tenderness, deformity or signs of injury.     Right lower leg: No edema.     Left lower leg: No edema.  Skin:  General: Skin is warm and dry.     Capillary Refill: Capillary refill takes less than 2 seconds.     Coloration: Skin is not jaundiced.     Findings: No bruising or lesion.  Neurological:     Mental Status: She is alert and oriented to person, place, and time.     Cranial Nerves: No cranial nerve deficit.     Sensory: No sensory deficit.     Motor: No weakness.     Coordination: Coordination normal.     Gait: Gait normal.  Psychiatric:        Mood and Affect: Mood normal.        Behavior: Behavior normal.        Thought Content: Thought content normal.        Judgment: Judgment normal.     Assessment & Plan:   Problem List Items Addressed This Visit       Endocrine   Right tubo-ovarian abscess - Primary    Patient currently denies fever, chills, nausea, vomiting, has tenderness of right pelvic  area on palpation.  She has completed the full course of doxycycline and Augmentin ordered. Patient is encouraged to call the OB/GYN office and schedule an appointment for follow-up she verbalized understanding. Check CBC.        Genitourinary   Trichomonal vaginitis    She received IV metronidazole while in the hospital Patient counseled on safe sexual intercourse to prevent STI She was advised to encourage her sexual partner to also get tested and treated if positive         Other   Need for immunization against influenza    Patient educated on CDC recommendation for the  influenza  vaccine. Verbal consent was obtained from the patient, vaccine administered by nurse, no sign of adverse reactions noted at this time. Patient education on arm soreness and use of tylenol for this patient  was discussed. Patient educated on the signs and symptoms of adverse effect and advise to contact the office if they occur.       Relevant Orders   Flu Vaccine QUAD 58moIM (Fluarix, Fluzone & Alfiuria Quad PF) (Completed)   Anemia    Lab Results  Component Value Date   WBC 6.1 04/01/2022   HGB 9.8 (L) 04/01/2022   HCT 29.9 (L) 04/01/2022   MCV 89.8 04/01/2022   PLT 430 (H) 04/01/2022  She currently denies fatigue, abnormal  bleeding Check CBC      Relevant Orders   [facility]     patient was on admission from October 11 to October 16.  Patient states that she feels much better   Patient states that she had a painful sexual intercourse 7 days ago. she currently denies fever, chills, nausea, vomiting, abdominal pain.  States she has completed course of doxycycline 100 mg twice daily, Augmentin 1 tablet twice daily that was ordered for her at discharge.  She will was referred to OB/GYN but she has not heard from them.  Hospital discharge summary, labs and imaging studies reviewed by me today      Marijuana use    Current daily marijuana use Need to avoid smoking marijuana due to risk of respiratory disease and other complications discussed with patient she verbalized understanding.      Other Visit Diagnoses     Need for hepatitis C screening test       Relevant Orders   Hepatitis C antibody  Outpatient Encounter Medications as of 04/15/2022  Medication Sig   acetaminophen (TYLENOL) 325 MG tablet Take 2 tablets (650 mg total) by mouth every 6 (six) hours as needed for mild pain, moderate pain or fever.   [DISCONTINUED] lactulose (CHRONULAC) 10 GM/15ML solution Take 15 mLs (10 g total) by mouth daily as needed for mild constipation.    No facility-administered encounter medications on file as of 04/15/2022.    Follow-up: Return in about 3 months (around 07/16/2022) for CPE.   Renee Rival, FNP

## 2022-04-16 LAB — CBC
Hematocrit: 37.1 % (ref 34.0–46.6)
Hemoglobin: 11.9 g/dL (ref 11.1–15.9)
MCH: 28.9 pg (ref 26.6–33.0)
MCHC: 32.1 g/dL (ref 31.5–35.7)
MCV: 90 fL (ref 79–97)
Platelets: 571 10*3/uL — ABNORMAL HIGH (ref 150–450)
RBC: 4.12 x10E6/uL (ref 3.77–5.28)
RDW: 12.8 % (ref 11.7–15.4)
WBC: 6.9 10*3/uL (ref 3.4–10.8)

## 2022-04-16 LAB — HEPATITIS C ANTIBODY: Hep C Virus Ab: NONREACTIVE

## 2022-04-16 NOTE — Progress Notes (Signed)
Normal blood count, platelet count is high could be related to her recent PID, patient should report abnormal bleeding, will recheck labs at her next visit.  Hep C negative.

## 2022-06-24 ENCOUNTER — Ambulatory Visit
Admission: EM | Admit: 2022-06-24 | Discharge: 2022-06-24 | Disposition: A | Discharge status: Home / Self Care | Payer: Medicaid Other | Attending: Emergency Medicine | Admitting: Emergency Medicine

## 2022-06-24 DIAGNOSIS — L03213 Periorbital cellulitis: Secondary | ICD-10-CM | POA: Diagnosis not present

## 2022-06-24 DIAGNOSIS — H00014 Hordeolum externum left upper eyelid: Secondary | ICD-10-CM | POA: Diagnosis not present

## 2022-06-24 MED ORDER — CEFDINIR 300 MG PO CAPS: 300.0000 mg | ORAL_CAPSULE | Freq: Two times a day (BID) | ORAL | 0 refills | Status: AC

## 2022-06-24 NOTE — ED Triage Notes (Signed)
Pt states that she has some swelling and pain of her left eye.

## 2022-06-24 NOTE — Discharge Instructions (Signed)
I have enclosed information about preseptal cellulitis that I hope you find helpful.  I have sent a prescription for Omnicef to your pharmacy, please take 1 capsule twice daily for the next 7 days.  If your eye does not significantly improve after 7 days of antibiotics or becomes worse while taking antibiotics, please follow-up with your primary care provider, an ophthalmologist or the emergency room.

## 2022-06-24 NOTE — ED Provider Notes (Signed)
[facility]    CSN: [id number] Arrival date & time: 06/24/22  1052    HISTORY   Chief Complaint  Patient presents with   Eye Problem    Entered by patient   HPI Rebecca Roy is a pleasant, 22 y.o. female who presents to urgent care today. Pt states that she has some swelling and pain of her left upper eyelid that appeared this morning.  Patient states she wears fake lashes and this has happened before.  Patient states she is not wearing fake lashes at this time, does not wear contacts.  Patient states she has not tried anything to alleviate her symptoms.  Patient denies eye pain, vision changes.  The history is provided by the patient.   Past Medical History:  Diagnosis Date   Gonorrhea    Patient Active Problem List   Diagnosis Date Noted   Need for immunization against influenza 04/15/2022   Anemia 04/15/2022   Hospital discharge follow-up 04/15/2022   Trichomonal vaginitis 04/15/2022   Marijuana use 04/15/2022   Right tubo-ovarian abscess 03/27/2022   History of PID 12/19/2020   History of gonorrhea 12/19/2020   Contraception management 09/07/2016   Vaginal bleeding 09/07/2016   Induration of skin 09/07/2016   Encounter for initial prescription of injectable contraceptive 11/22/2015   Annual physical exam 08/31/2015   History reviewed. No pertinent surgical history. OB History     Gravida  0   Para  0   Term  0   Preterm  0   AB  0   Living  0      SAB  0   IAB  0   Ectopic  0   Multiple  0   Live Births  0          Home Medications    Prior to Admission medications   Medication Sig Start Date End Date Taking? Authorizing Provider  acetaminophen (TYLENOL) 325 MG tablet Take 2 tablets (650 mg total) by mouth every 6 (six) hours as needed for mild pain, moderate pain or fever. 03/21/17   Jean Rosenthal, NP    Family History Family History  Problem Relation Age of Onset   Heart disease Mother    Heart disease Father     Heart disease Maternal Aunt    Diabetes Maternal Aunt    Hypertension Maternal Aunt    Heart disease Maternal Grandmother    Diabetes Maternal Grandmother    Hypertension Maternal Grandmother    Other Maternal Grandfather        blood clot in lung   Breast cancer Paternal Grandmother    Cervical cancer Neg Hx    Social History Social History   Tobacco Use   Smoking status: Never    Passive exposure: Yes   Smokeless tobacco: Never  Vaping Use   Vaping Use: Every day   Substances: Nicotine, Flavoring  Substance Use Topics   Alcohol use: No   Drug use: Yes    Types: Marijuana    Comment: everyday   Allergies   Patient has no known allergies.  Review of Systems Review of Systems Pertinent findings revealed after performing a 14 point review of systems has been noted in the history of present illness.  Physical Exam Triage Vital Signs ED Triage Vitals  Enc Vitals Group     BP 04/13/21 0827 (!) 147/82     Pulse Rate 04/13/21 0827 72     Resp 04/13/21 0827 18  Temp 04/13/21 0827 98.3 F (36.8 C)     Temp Source 04/13/21 0827 Oral     SpO2 04/13/21 0827 98 %     Weight --      Height --      Head Circumference --      Peak Flow --      Pain Score 04/13/21 0826 5     Pain Loc --      Pain Edu? --      Excl. in [place]? --   No data found.  Updated Vital Signs BP 113/74 (BP Location: Right Arm)   Pulse 71   Temp 97.7 F (36.5 C) (Oral)   Resp 20   Ht '5\' 3"'$  (1.6 m)   Wt 128 lb (58.1 kg)   LMP 06/03/2022   SpO2 98%   BMI 22.67 kg/m   Physical Exam Vitals and nursing note reviewed.  Constitutional:      General: She is not in acute distress.    Appearance: Normal appearance. She is not ill-appearing.  HENT:     Head: Normocephalic and atraumatic.     Salivary Glands: Right salivary gland is not diffusely enlarged or tender. Left salivary gland is not diffusely enlarged or tender.     Right Ear: Tympanic membrane, ear canal and external ear normal. No  drainage. No middle ear effusion. There is no impacted cerumen. Tympanic membrane is not erythematous or bulging.     Left Ear: Tympanic membrane, ear canal and external ear normal. No drainage.  No middle ear effusion. There is no impacted cerumen. Tympanic membrane is not erythematous or bulging.     Nose: Nose normal. No nasal deformity, septal deviation, mucosal edema, congestion or rhinorrhea.     Right Turbinates: Not enlarged, swollen or pale.     Left Turbinates: Not enlarged, swollen or pale.     Right Sinus: No maxillary sinus tenderness or frontal sinus tenderness.     Left Sinus: No maxillary sinus tenderness or frontal sinus tenderness.     Mouth/Throat:     Lips: Pink. No lesions.     Mouth: Mucous membranes are moist. No oral lesions.     Pharynx: Oropharynx is clear. Uvula midline. No posterior oropharyngeal erythema or uvula swelling.     Tonsils: No tonsillar exudate. 0 on the right. 0 on the left.  Eyes:     General:        Right eye: No discharge.        Left eye: No discharge.     Extraocular Movements: Extraocular movements intact.     Conjunctiva/sclera: Conjunctivae normal.     Right eye: Right conjunctiva is not injected. No chemosis, exudate or hemorrhage.    Left eye: Left conjunctiva is not injected. Exudate present. No chemosis or hemorrhage.    Pupils: Pupils are equal, round, and reactive to light.     Comments: Left upper eyelid with erythema and swelling medially, mild crusting at medial canthus  Neck:     Trachea: Trachea and phonation normal.  Cardiovascular:     Rate and Rhythm: Normal rate and regular rhythm.     Pulses: Normal pulses.     Heart sounds: Normal heart sounds. No murmur heard.    No friction rub. No gallop.  Pulmonary:     Effort: Pulmonary effort is normal. No accessory muscle usage, prolonged expiration or respiratory distress.     Breath sounds: Normal breath sounds. No stridor, decreased air movement or transmitted upper  airway  sounds. No decreased breath sounds, wheezing, rhonchi or rales.  Chest:     Chest wall: No tenderness.  Musculoskeletal:        General: Normal range of motion.     Cervical back: Normal range of motion and neck supple. Normal range of motion.  Lymphadenopathy:     Cervical: No cervical adenopathy.  Skin:    General: Skin is warm and dry.     Findings: No erythema or rash.  Neurological:     General: No focal deficit present.     Mental Status: She is alert and oriented to person, place, and time.  Psychiatric:        Mood and Affect: Mood normal.        Behavior: Behavior normal.     Visual Acuity Right Eye Distance:   Left Eye Distance:   Bilateral Distance:    Right Eye Near:   Left Eye Near:    Bilateral Near:     UC Couse / Diagnostics / Procedures:     Radiology No results found.  Procedures Procedures (including critical care time) EKG  Pending results:  Labs Reviewed - No data to display  Medications Ordered in UC: Medications - No data to display  UC Diagnoses / Final Clinical Impressions(s)   I have reviewed the triage vital signs and the nursing notes.  Pertinent labs & imaging results that were available during my care of the patient were reviewed by me and considered in my medical decision making (see chart for details).    Final diagnoses:  Hordeolum externum of left upper eyelid  Preseptal cellulitis of left upper eyelid   Patient was provided with a 7-day course of cefdinir for empiric treatment of presumed preseptal cellulitis secondary to hordeolum of left upper eyelid.  Return precautions advised.  Education regarding self-care at home provided.  Please see discharge instructions below for details of plan of care as provided to patient. ED Prescriptions     Medication Sig Dispense Auth. Provider   cefdinir (OMNICEF) 300 MG capsule Take 1 capsule (300 mg total) by mouth 2 (two) times daily for 7 days. 14 capsule Lynden Oxford Scales,  PA-C      PDMP not reviewed this encounter.  Pending results:  Labs Reviewed - No data to display  Discharge Instructions:   Discharge Instructions      I have enclosed information about preseptal cellulitis that I hope you find helpful.  I have sent a prescription for Omnicef to your pharmacy, please take 1 capsule twice daily for the next 7 days.  If your eye does not significantly improve after 7 days of antibiotics or becomes worse while taking antibiotics, please follow-up with your primary care provider, an ophthalmologist or the emergency room.      Disposition Upon Discharge:  Condition: stable for discharge home  Patient presented with an acute illness with associated systemic symptoms and significant discomfort requiring urgent management. In my opinion, this is a condition that a prudent lay person (someone who possesses an average knowledge of health and medicine) may potentially expect to result in complications if not addressed urgently such as respiratory distress, impairment of bodily function or dysfunction of bodily organs.   Routine symptom specific, illness specific and/or disease specific instructions were discussed with the patient and/or caregiver at length.   As such, the patient has been evaluated and assessed, work-up was performed and treatment was provided in alignment with urgent care protocols and evidence based  medicine.  Patient/parent/caregiver has been advised that the patient may require follow up for further testing and treatment if the symptoms continue in spite of treatment, as clinically indicated and appropriate.  Patient/parent/caregiver has been advised to return to the [facility] or PCP if no better; to PCP or the Emergency Department if new signs and symptoms develop, or if the current signs or symptoms continue to change or worsen for further workup, evaluation and treatment as clinically indicated and appropriate  The patient will follow up  with their current PCP if and as advised. If the patient does not currently have a PCP we will assist them in obtaining one.   The patient may need specialty follow up if the symptoms continue, in spite of conservative treatment and management, for further workup, evaluation, consultation and treatment as clinically indicated and appropriate.  Patient/parent/caregiver verbalized understanding and agreement of plan as discussed.  All questions were addressed during visit.  Please see discharge instructions below for further details of plan.  This office note has been dictated using Museum/gallery curator.  Unfortunately, this method of dictation can sometimes lead to typographical or grammatical errors.  I apologize for your inconvenience in advance if this occurs.  Please do not hesitate to reach out to me if clarification is needed.      Lynden Oxford Scales, PA-C 06/24/22 1200

## 2022-07-15 ENCOUNTER — Ambulatory Visit: Payer: Medicaid Other | Admitting: Nurse Practitioner

## 2022-07-15 ENCOUNTER — Other Ambulatory Visit (HOSPITAL_COMMUNITY)
Admission: RE | Admit: 2022-07-15 | Discharge: 2022-07-15 | Disposition: A | Discharge status: Home / Self Care | Payer: Medicaid Other | Source: Ambulatory Visit | Attending: Nurse Practitioner | Admitting: Nurse Practitioner

## 2022-07-15 ENCOUNTER — Encounter: Payer: Self-pay | Admitting: Nurse Practitioner

## 2022-07-15 VITALS — BP 109/68 | HR 67 | Temp 98.2°F | Ht 63.0 in | Wt 131.6 lb

## 2022-07-15 DIAGNOSIS — Z1329 Encounter for screening for other suspected endocrine disorder: Secondary | ICD-10-CM

## 2022-07-15 DIAGNOSIS — R7989 Other specified abnormal findings of blood chemistry: Secondary | ICD-10-CM | POA: Diagnosis not present

## 2022-07-15 DIAGNOSIS — Z131 Encounter for screening for diabetes mellitus: Secondary | ICD-10-CM | POA: Diagnosis not present

## 2022-07-15 DIAGNOSIS — Z0000 Encounter for general adult medical examination without abnormal findings: Secondary | ICD-10-CM

## 2022-07-15 DIAGNOSIS — Z13220 Encounter for screening for lipoid disorders: Secondary | ICD-10-CM | POA: Diagnosis not present

## 2022-07-15 DIAGNOSIS — F1290 Cannabis use, unspecified, uncomplicated: Secondary | ICD-10-CM

## 2022-07-15 DIAGNOSIS — Z124 Encounter for screening for malignant neoplasm of cervix: Secondary | ICD-10-CM | POA: Insufficient documentation

## 2022-07-15 DIAGNOSIS — Z1321 Encounter for screening for nutritional disorder: Secondary | ICD-10-CM

## 2022-07-15 DIAGNOSIS — N7093 Salpingitis and oophoritis, unspecified: Secondary | ICD-10-CM

## 2022-07-15 LAB — RESULTS CONSOLE HPV: CHL HPV: NEGATIVE

## 2022-07-15 NOTE — Patient Instructions (Signed)
[facility] [employer] at [facility]. Schedule an appointment as soon as possible for a visit in 2 week(s).   Specialty: Obstetrics and Gynecology Contact information: [place] [phone].    It is important that you exercise regularly at least 30 minutes 5 times a week as tolerated  Think about what you will eat, plan ahead. Choose " clean, green, fresh or frozen" over canned, processed or packaged foods which are more sugary, salty and fatty. 70 to 75% of food eaten should be vegetables and fruit. Three meals at set times with snacks allowed between meals, but they must be fruit or vegetables. Aim to eat over a 12 hour period , example 7 am to 7 pm, and STOP after  your last meal of the day. Drink water,generally about 64 ounces per day, no other drink is as healthy. Fruit juice is best enjoyed in a healthy way, by EATING the fruit.  Thanks for choosing [facility] we consider it a privelige to serve you.

## 2022-07-15 NOTE — Assessment & Plan Note (Addendum)
She denies pelvic pain, fever, chills, abdominal pain. , she has not seen GYN, phone number and office address of GYN given , she was encouraged to contact the office for follow up . Cervical PAP exam completed without difficulty today.

## 2022-07-15 NOTE — Assessment & Plan Note (Signed)
Annual exam as documented.  Counseling done include healthy lifestyle involving committing to 150 minutes of exercise per week, heart healthy diet, and attaining healthy weight. The importance of adequate sleep also discussed.  Regular use of seat belt and home safety were also discussed .  Immunization and cancer screening  needs are specifically addressed at this visit.   PAP exam with cytology today, up to date with influenza vaccine, encouraged to get TDAP vaccine.  Routine labs ordered.  Self breast exam encouraged.

## 2022-07-15 NOTE — Progress Notes (Signed)
Complete physical exam  Patient: Rebecca Roy   DOB: Aug 16, 2000   22 y.o. Female  MRN: [id number]  Subjective:    Chief Complaint  Patient presents with   Follow-up    Rebecca Roy is a 22 y.o. femaleRight tubo-ovarian abscess, PID, gonorrhea who presents today for a complete physical exam. She reports consuming a general diet.  States that she does a lot of walking and climbing stairs at her work . She generally feels well. She reports  She does not have additional problems to discuss today.   She was referred to [facility] during her last hospitalization but she stated that she did not her from them, number to the GYN office and address given today, she was encouraged to contact them. She was encouraged to ger her TDAP vaccine at the pharmacy.    Most recent fall risk assessment:    12/19/2020    3:13 PM  [place] in the past year? 0     Most recent depression screenings:    07/15/2022    3:11 PM 04/15/2022    3:11 PM  PHQ 2/9 Scores  PHQ - 2 Score 0 0  PHQ- 9 Score  4        Patient Care Team: Pcp, No as PCP - General   No outpatient medications prior to visit.   No facility-administered medications prior to visit.    Review of Systems  Constitutional: Negative.  Negative for chills, diaphoresis, fever, malaise/fatigue and weight loss.  HENT: Negative.  Negative for congestion, ear discharge, ear pain, hearing loss, nosebleeds, sinus pain, sore throat and tinnitus.   Eyes:  Negative for photophobia, pain, discharge and redness.  Respiratory:  Negative for stridor.   Cardiovascular: Negative.  Negative for chest pain, palpitations, orthopnea, claudication, leg swelling and PND.  Gastrointestinal:  Negative for abdominal pain, constipation, diarrhea, heartburn, nausea and vomiting.  Genitourinary: Negative.  Negative for dysuria, flank pain, frequency, hematuria and urgency.  Musculoskeletal: Negative.  Negative for back pain, falls, joint pain, myalgias  and neck pain.  Skin: Negative.  Negative for itching and rash.  Neurological: Negative.  Negative for dizziness, tingling, tremors, sensory change, speech change, seizures, loss of consciousness, weakness and headaches.  Endo/Heme/Allergies: Negative.  Negative for polydipsia.  Psychiatric/Behavioral: Negative.  Negative for depression, hallucinations, memory loss, substance abuse and suicidal ideas. The patient is not nervous/anxious and does not have insomnia.           Objective:     BP 109/68   Pulse 67   Temp 98.2 F (36.8 C)   Ht '5\' 3"'$  (1.6 m)   Wt 131 lb 9.6 oz (59.7 kg)   LMP 07/15/2022   SpO2 100%   BMI 23.31 kg/m    Physical Exam Vitals and nursing note reviewed. Exam conducted with a chaperone present.  Constitutional:      General: She is not in acute distress.    Appearance: Normal appearance. She is normal weight. She is not ill-appearing, toxic-appearing or diaphoretic.  HENT:     Head: Normocephalic and atraumatic.     Right Ear: Tympanic membrane, ear canal and external ear normal. There is no impacted cerumen.     Left Ear: Tympanic membrane, ear canal and external ear normal. There is no impacted cerumen.     Nose: Nose normal. No congestion or rhinorrhea.     Mouth/Throat:     Mouth: Mucous membranes are moist.     Pharynx: Oropharynx is  clear. No oropharyngeal exudate or posterior oropharyngeal erythema.  Eyes:     General: No scleral icterus.       Right eye: No discharge.        Left eye: No discharge.     Extraocular Movements: Extraocular movements intact.     Conjunctiva/sclera: Conjunctivae normal.  Neck:     Vascular: No carotid bruit.  Cardiovascular:     Rate and Rhythm: Normal rate and regular rhythm.     Pulses: Normal pulses.     Heart sounds: Normal heart sounds. No murmur heard.    No friction rub. No gallop.  Pulmonary:     Effort: Pulmonary effort is normal. No respiratory distress.     Breath sounds: Normal breath sounds. No  stridor. No wheezing, rhonchi or rales.  Chest:     Chest wall: No mass, lacerations, deformity, swelling, tenderness, crepitus or edema.  Breasts:    Tanner Score is 5.     Breasts are symmetrical.     Right: No swelling, bleeding, inverted nipple, mass, nipple discharge, skin change or tenderness.     Left: No swelling, bleeding, inverted nipple, mass, nipple discharge, skin change or tenderness.  Abdominal:     General: Abdomen is flat. There is no distension.     Palpations: Abdomen is soft. There is no mass.     Tenderness: There is no abdominal tenderness. There is no right CVA tenderness, left CVA tenderness, guarding or rebound.     Hernia: No hernia is present. There is no hernia in the left inguinal area or right inguinal area.  Genitourinary:    Exam position: Lithotomy position.     Pubic Area: No rash or pubic lice.      Tanner stage (genital): 5.     Labia:        Right: No rash, tenderness, lesion or injury.        Left: No rash, tenderness, lesion or injury.      Urethra: No prolapse, urethral pain, urethral swelling or urethral lesion.     Vagina: No signs of injury and foreign body. No vaginal discharge, erythema, tenderness, bleeding, lesions or prolapsed vaginal walls.     Cervix: No cervical motion tenderness, discharge, friability, lesion, erythema, cervical bleeding or eversion.     Uterus: Not deviated, not enlarged, not fixed, not tender and no uterine prolapse.      Adnexa:        Right: No mass, tenderness or fullness.         Left: No mass, tenderness or fullness.       Rectum: No external hemorrhoid.  Musculoskeletal:        General: No swelling, tenderness, deformity or signs of injury.     Cervical back: Normal range of motion and neck supple. No rigidity or tenderness.     Right lower leg: No edema.     Left lower leg: No edema.  Lymphadenopathy:     Cervical: No cervical adenopathy.     Upper Body:     Right upper body: No supraclavicular, axillary  or pectoral adenopathy.     Left upper body: No supraclavicular, axillary or pectoral adenopathy.     Lower Body: No right inguinal adenopathy. No left inguinal adenopathy.  Skin:    General: Skin is warm and dry.     Capillary Refill: Capillary refill takes less than 2 seconds.     Coloration: Skin is not jaundiced or pale.  Findings: No bruising, erythema, lesion or rash.  Neurological:     Mental Status: She is alert and oriented to person, place, and time.     Cranial Nerves: No cranial nerve deficit.     Sensory: No sensory deficit.     Motor: No weakness.     Coordination: Coordination normal.     Gait: Gait normal.  Psychiatric:        Mood and Affect: Mood normal.        Behavior: Behavior normal.        Thought Content: Thought content normal.        Judgment: Judgment normal.      No results found for any visits on 07/15/22.     Assessment & Plan:    Routine Health Maintenance and Physical Exam  Immunization History  Administered Date(s) Administered   HPV 9-valent 08/31/2015, 11/22/2015, 03/06/2016   Hepatitis A, Ped/Adol-2 Dose 08/31/2015   Hepatitis B, PED/ADOLESCENT Nov [birth year], 1999   Influenza,inj,Quad PF,6+ Mos 08/31/2015, 04/15/2022   Meningococcal Conjugate 08/31/2015    Health Maintenance  Topic Date Due   COVID-19 Vaccine (1) Never done   DTaP/Tdap/Td (1 - Tdap) Never done   PAP-Cervical Cytology Screening  Never done   PAP SMEAR-Modifier  Never done   CHLAMYDIA SCREENING  03/28/2023   INFLUENZA VACCINE  Completed   HPV VACCINES  Completed   Hepatitis C Screening  Completed   HIV Screening  Completed    Discussed health benefits of physical activity, and encouraged her to engage in regular exercise appropriate for her age and condition.  Problem List Items Addressed This Visit       Endocrine   Right tubo-ovarian abscess    She denies pelvic pain, fever, chills, abdominal pain. , she has not seen GYN, phone number and office address of GYN  given , she was encouraged to contact the office for follow up . Cervical PAP exam completed without difficulty today.         Other   Annual physical exam - Primary    Annual exam as documented.  Counseling done include healthy lifestyle involving committing to 150 minutes of exercise per week, heart healthy diet, and attaining healthy weight. The importance of adequate sleep also discussed.  Regular use of seat belt and home safety were also discussed .  Immunization and cancer screening  needs are specifically addressed at this visit.   PAP exam with cytology today, up to date with influenza vaccine, encouraged to get TDAP vaccine.  Routine labs ordered.  Self breast exam encouraged.       Marijuana use    Current daily marijuana use. Need to avoid smoking  marijuana discussed.       Elevated platelet count    Lab Results  Component Value Date   PLT 571 (H) 04/15/2022   Rechecking platelet count today.  She denies abnormal bleeding.       Relevant Orders   Platelet count   Screening for cervical cancer     - Cytology - PAP([place])        Relevant Orders   Cytology - PAP()   Other Visit Diagnoses     Screening for lipid disorders       Relevant Orders   Lipid panel   Screening for diabetes mellitus       Relevant Orders   Hemoglobin A1c   Encounter for vitamin deficiency screening       Relevant Orders   VITAMIN  D 25 Hydroxy (Vit-D Deficiency, Fractures)   Screening for thyroid disorder       Relevant Orders   TSH      Return in about 1 year (around 07/16/2023) for CPE.     Renee Rival, FNP

## 2022-07-15 NOTE — Assessment & Plan Note (Addendum)
Current daily marijuana use. Need to avoid smoking  marijuana discussed.

## 2022-07-15 NOTE — Assessment & Plan Note (Signed)
Lab Results  Component Value Date   PLT 571 (H) 04/15/2022   Rechecking platelet count today.  She denies abnormal bleeding.

## 2022-07-15 NOTE — Assessment & Plan Note (Signed)
-   Cytology - PAP([place])

## 2022-07-18 LAB — CYTOLOGY - PAP: Diagnosis: NEGATIVE

## 2022-07-19 NOTE — Progress Notes (Signed)
Normal PAP cytology

## 2023-02-24 ENCOUNTER — Encounter: Payer: Self-pay | Admitting: Nurse Practitioner

## 2023-02-24 ENCOUNTER — Ambulatory Visit: Payer: Medicaid Other | Admitting: Nurse Practitioner

## 2023-02-24 VITALS — BP 99/64 | HR 76 | Temp 97.1°F | Wt 127.0 lb

## 2023-02-24 DIAGNOSIS — Z13228 Encounter for screening for other metabolic disorders: Secondary | ICD-10-CM

## 2023-02-24 DIAGNOSIS — Z1329 Encounter for screening for other suspected endocrine disorder: Secondary | ICD-10-CM | POA: Diagnosis not present

## 2023-02-24 DIAGNOSIS — Z130 Encounter for screening for diseases of the blood and blood-forming organs and certain disorders involving the immune mechanism: Secondary | ICD-10-CM

## 2023-02-24 DIAGNOSIS — Z1321 Encounter for screening for nutritional disorder: Secondary | ICD-10-CM

## 2023-02-24 DIAGNOSIS — N6313 Unspecified lump in the right breast, lower outer quadrant: Secondary | ICD-10-CM | POA: Diagnosis not present

## 2023-02-24 NOTE — Assessment & Plan Note (Addendum)
Mobile Mass most likely a cyst Will obtain a breast ultrasound to further evaluate it  - [place] LIMITED ULTRASOUND INCLUDING AXILLA RIGHT BREAST; Future

## 2023-02-24 NOTE — Patient Instructions (Addendum)
  Please call [facility] breast center on [phone] to schedule your breast ultrasound   It is important that you exercise regularly at least 30 minutes 5 times a week as tolerated  Think about what you will eat, plan ahead. Choose " clean, green, fresh or frozen" over canned, processed or packaged foods which are more sugary, salty and fatty. 70 to 75% of food eaten should be vegetables and fruit. Three meals at set times with snacks allowed between meals, but they must be fruit or vegetables. Aim to eat over a 12 hour period , example 7 am to 7 pm, and STOP after  your last meal of the day. Drink water,generally about 64 ounces per day, no other drink is as healthy. Fruit juice is best enjoyed in a healthy way, by EATING the fruit.  Thanks for choosing [facility] we consider it a privelige to serve you.

## 2023-02-24 NOTE — Progress Notes (Signed)
Acute Office Visit  Subjective:     Patient ID: Rebecca Roy, female    DOB: [birth year], 22 y.o.   MRN: [id number]  Chief Complaint  Patient presents with   Breast Mass    Right     HPI Rebecca Roy  has a past medical history of Gonorrhea.  Patient presents with complaints of right breast mass that she has noticed about a year now.  She breast denies pain, nipple discharge.  No unintentional weight loss.  Stated that her maternal grandmother had breast cancer.  She had pending labs from her annual physical that needs to be done.  Labs collected today   Review of Systems  Constitutional:  Negative for activity change, appetite change, chills, fatigue and fever.  HENT:  Negative for congestion, dental problem, ear discharge, ear pain and hearing loss.   Respiratory:  Negative for cough, chest tightness, shortness of breath and wheezing.   Cardiovascular:  Negative for chest pain, palpitations and leg swelling.  Gastrointestinal:  Negative for abdominal distention, abdominal pain, anal bleeding and blood in stool.  Genitourinary:  Negative for difficulty urinating, dysuria, flank pain, frequency, hematuria, menstrual problem, pelvic pain and vaginal bleeding.  Musculoskeletal:  Negative for arthralgias, back pain, gait problem, joint swelling and myalgias.  Skin:  Negative for color change, pallor, rash and wound.  Neurological:  Negative for dizziness, tremors, facial asymmetry, weakness and headaches.  Hematological:  Negative for adenopathy. Does not bruise/bleed easily.  Psychiatric/Behavioral:  Negative for agitation, behavioral problems, confusion, decreased concentration, hallucinations, self-injury and suicidal ideas.         Objective:    BP 99/64   Pulse 76   Temp (!) 97.1 F (36.2 C)   Wt 127 lb (57.6 kg)   SpO2 100%   BMI 22.50 kg/m    Physical Exam Vitals and nursing note reviewed. Exam conducted with a chaperone present.  Constitutional:      General:  She is not in acute distress.    Appearance: Normal appearance. She is not ill-appearing, toxic-appearing or diaphoretic.  HENT:     Mouth/Throat:     Mouth: Mucous membranes are moist.     Pharynx: Oropharynx is clear. No oropharyngeal exudate or posterior oropharyngeal erythema.  Eyes:     General: No scleral icterus.       Right eye: No discharge.        Left eye: No discharge.     Extraocular Movements: Extraocular movements intact.     Conjunctiva/sclera: Conjunctivae normal.  Cardiovascular:     Rate and Rhythm: Normal rate and regular rhythm.     Pulses: Normal pulses.     Heart sounds: Normal heart sounds. No murmur heard.    No friction rub. No gallop.  Pulmonary:     Effort: Pulmonary effort is normal. No respiratory distress.     Breath sounds: Normal breath sounds. No stridor. No wheezing, rhonchi or rales.  Chest:     Chest wall: No tenderness.  Breasts:    Right: Normal. No swelling, bleeding, inverted nipple, nipple discharge, skin change or tenderness.     Left: Normal. No swelling, bleeding, inverted nipple, nipple discharge, skin change or tenderness.     Comments: A mobile cyst like mass noted on the right lower quadrant.  No tenderness, nipple discharge skin discoloration noted Abdominal:     General: There is no distension.     Palpations: Abdomen is soft.     Tenderness: There is no abdominal tenderness.  There is no right CVA tenderness, left CVA tenderness or guarding.  Musculoskeletal:        General: No swelling, tenderness, deformity or signs of injury.     Right lower leg: No edema.     Left lower leg: No edema.  Lymphadenopathy:     Upper Body:     Right upper body: No supraclavicular, axillary or pectoral adenopathy.  Skin:    General: Skin is warm and dry.     Capillary Refill: Capillary refill takes less than 2 seconds.     Coloration: Skin is not jaundiced or pale.     Findings: No bruising, erythema or lesion.  Neurological:     Mental  Status: She is alert and oriented to person, place, and time.     Motor: No weakness.     Coordination: Coordination normal.     Gait: Gait normal.  Psychiatric:        Mood and Affect: Mood normal.        Behavior: Behavior normal.        Thought Content: Thought content normal.        Judgment: Judgment normal.     No results found for any visits on 02/24/23.      Assessment & Plan:   Problem List Items Addressed This Visit       Other   Mass of lower outer quadrant of right breast    Mobile Mass most likely a cyst Will obtain a breast ultrasound to further evaluate it  - [place] LIMITED ULTRASOUND INCLUDING AXILLA RIGHT BREAST; Future       Relevant Orders   [place] LIMITED ULTRASOUND INCLUDING AXILLA RIGHT BREAST   Other Visit Diagnoses     Screening for endocrine, nutritional, metabolic and immunity disorder    -  Primary   Relevant Orders   CBC   Lipid panel   CMP14+EGFR       No orders of the defined types were placed in this encounter.   Return in about 5 months (around 07/27/2023) for CPE.  Rebecca Beers, FNP

## 2023-02-25 LAB — CMP14+EGFR
ALT: 13 IU/L (ref 0–32)
AST: 18 IU/L (ref 0–40)
Albumin: 4.5 g/dL (ref 4.0–5.0)
Alkaline Phosphatase: 75 IU/L (ref 44–121)
BUN/Creatinine Ratio: 12 (ref 9–23)
BUN: 11 mg/dL (ref 6–20)
Bilirubin Total: 0.6 mg/dL (ref 0.0–1.2)
CO2: 21 mmol/L (ref 20–29)
Calcium: 8.8 mg/dL (ref 8.7–10.2)
Chloride: 104 mmol/L (ref 96–106)
Creatinine, Ser: 0.92 mg/dL (ref 0.57–1.00)
Globulin, Total: 2.4 g/dL (ref 1.5–4.5)
Glucose: 85 mg/dL (ref 70–99)
Potassium: 4.4 mmol/L (ref 3.5–5.2)
Sodium: 138 mmol/L (ref 134–144)
Total Protein: 6.9 g/dL (ref 6.0–8.5)
eGFR: 90 mL/min/{1.73_m2} (ref >59)

## 2023-02-25 LAB — CBC
Hematocrit: 40.1 % (ref 34.0–46.6)
Hemoglobin: 13.2 g/dL (ref 11.1–15.9)
MCH: 30.4 pg (ref 26.6–33.0)
MCHC: 32.9 g/dL (ref 31.5–35.7)
MCV: 92 fL (ref 79–97)
Platelets: 325 10*3/uL (ref 150–450)
RBC: 4.34 x10E6/uL (ref 3.77–5.28)
RDW: 12.7 % (ref 11.7–15.4)
WBC: 5 10*3/uL (ref 3.4–10.8)

## 2023-02-25 LAB — LIPID PANEL
Chol/HDL Ratio: 2.4 ratio (ref 0.0–4.4)
Cholesterol, Total: 134 mg/dL (ref 100–199)
HDL: 55 mg/dL (ref >39)
LDL Chol Calc (NIH): 71 mg/dL (ref 0–99)
Triglycerides: 25 mg/dL (ref 0–149)
VLDL Cholesterol Cal: 8 mg/dL (ref 5–40)

## 2023-03-13 ENCOUNTER — Other Ambulatory Visit: Payer: Medicaid Other

## 2023-03-26 ENCOUNTER — Other Ambulatory Visit: Payer: Self-pay | Admitting: Nurse Practitioner

## 2023-03-26 ENCOUNTER — Ambulatory Visit
Admission: RE | Admit: 2023-03-26 | Discharge: 2023-03-26 | Disposition: A | Discharge status: Home / Self Care | Payer: Medicaid Other | Source: Ambulatory Visit | Attending: Nurse Practitioner

## 2023-03-26 DIAGNOSIS — N6313 Unspecified lump in the right breast, lower outer quadrant: Secondary | ICD-10-CM

## 2023-03-26 DIAGNOSIS — N6310 Unspecified lump in the right breast, unspecified quadrant: Secondary | ICD-10-CM

## 2023-06-03 IMAGING — CT CT ABD-PELV W/O CM
2 of 4 series · 16 of 46 positions shown, 18 images · non-contrast
Comparison: Chest x-ray 11/23/2020

CLINICAL DATA: Right lower quadrant abdominal pain.

EXAM:
CT ABDOMEN AND PELVIS WITHOUT CONTRAST
TECHNIQUE: Multidetector CT imaging of the abdomen and pelvis was performed
following the standard protocol without IV contrast.

[Series 2: axial st · axial · 0.60mm/px · z∈[-445,-80]mm · 13 of 83 slices shown, 15 images]
[im 5/83  soft-tissue]
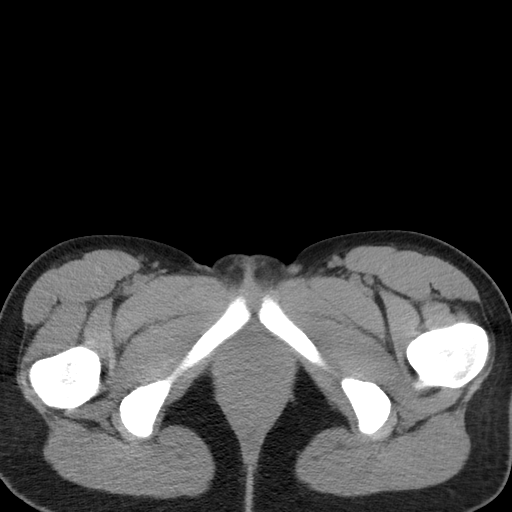
[im 5/83  bone]
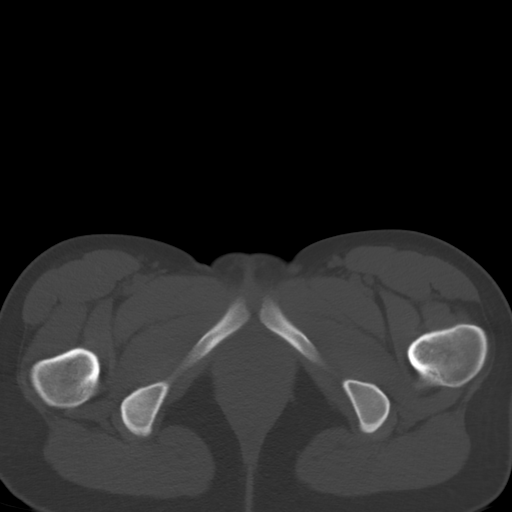
[im 10/83  soft-tissue]
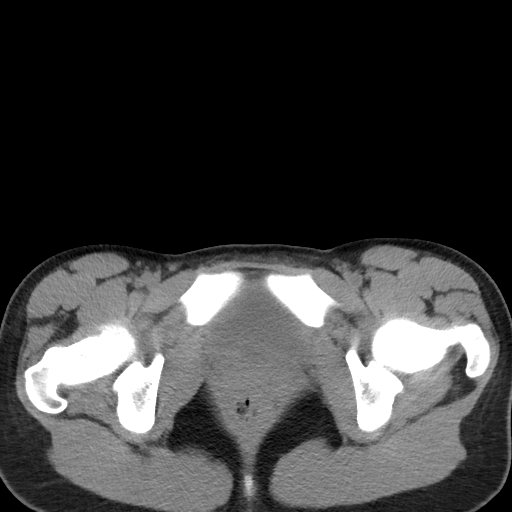
[im 20/83  soft-tissue]
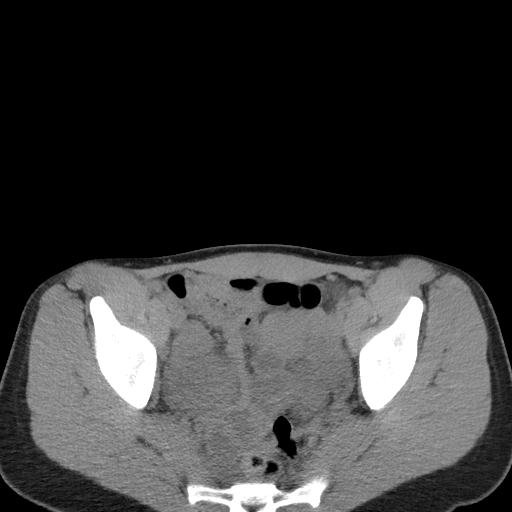
[im 25/83  soft-tissue]
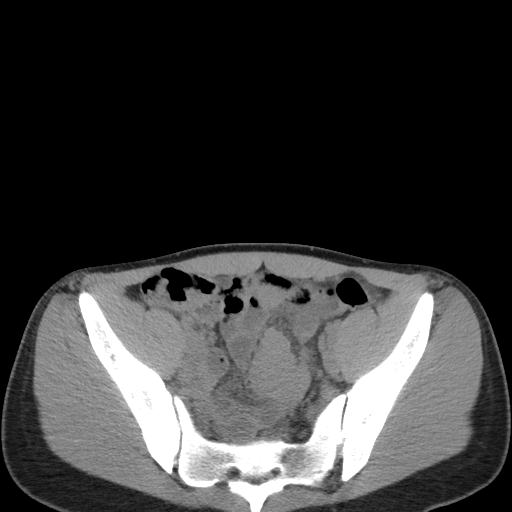
[im 29/83  soft-tissue]
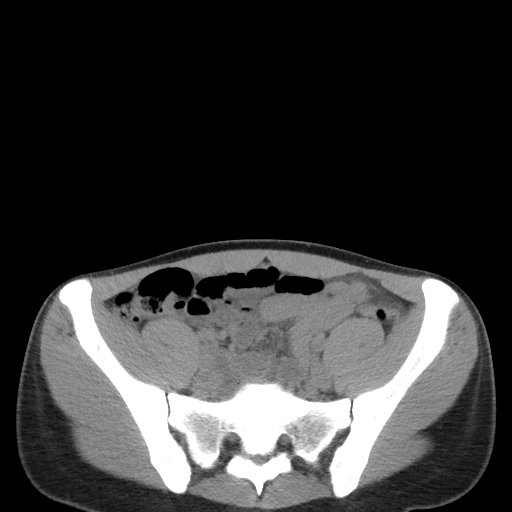
[im 34/83  soft-tissue]
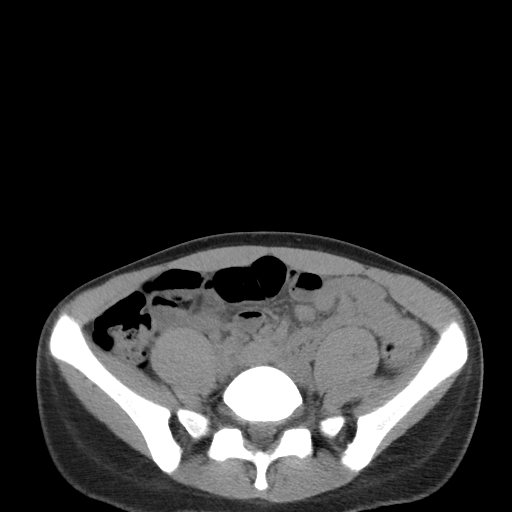
[im 44/83  soft-tissue]
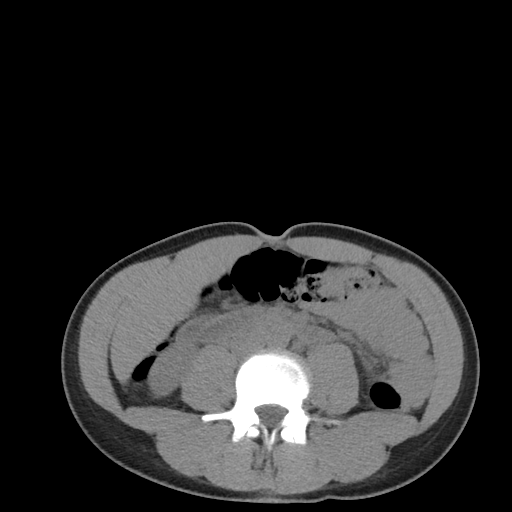
[im 49/83  soft-tissue]
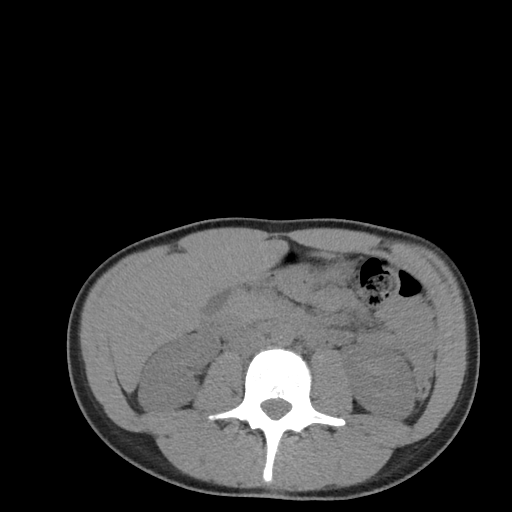
[im 54/83  soft-tissue]
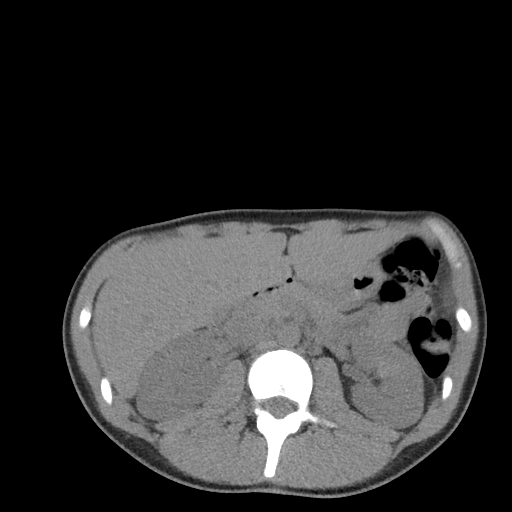
[im 54/83  bone]
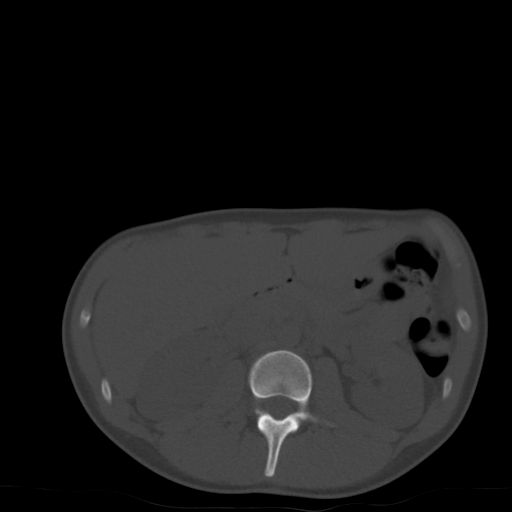
[im 58/83  soft-tissue]
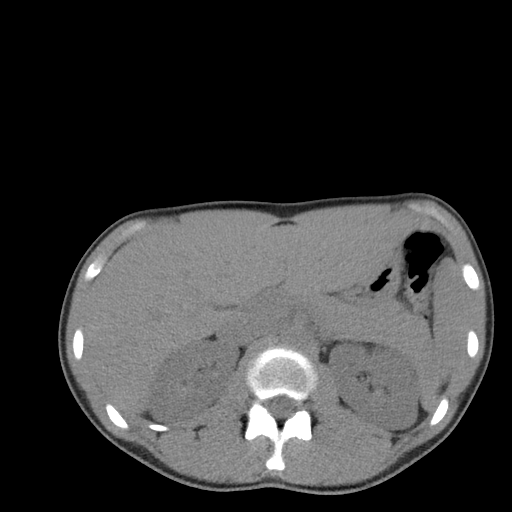
[im 63/83  soft-tissue]
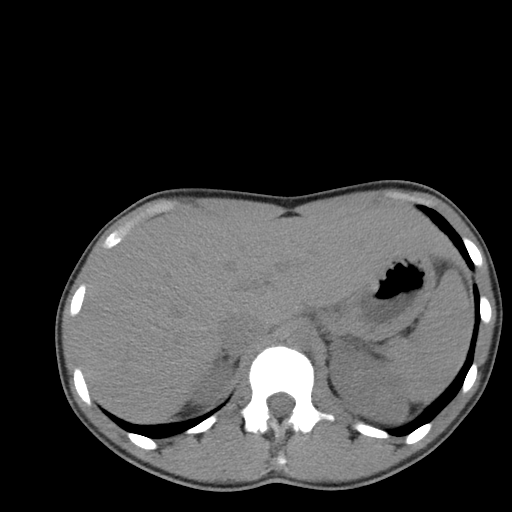
[im 73/83  soft-tissue]
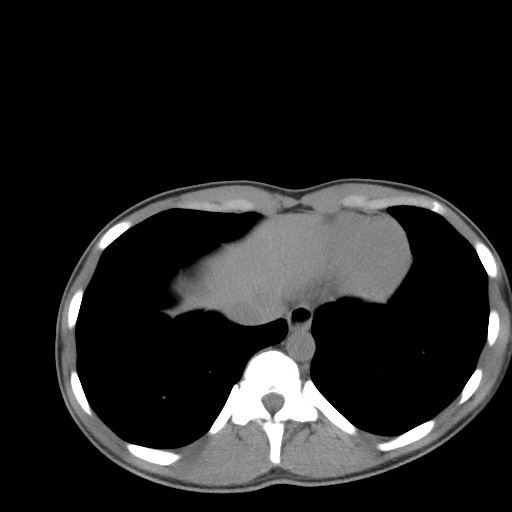
[im 78/83  soft-tissue]
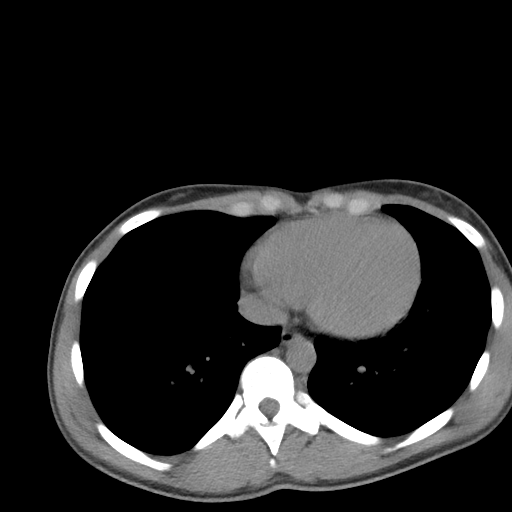

[Series 5: coronal st · coronal · 0.60mm/px · 3 of 61 slices shown]
[im 21/61  soft-tissue]
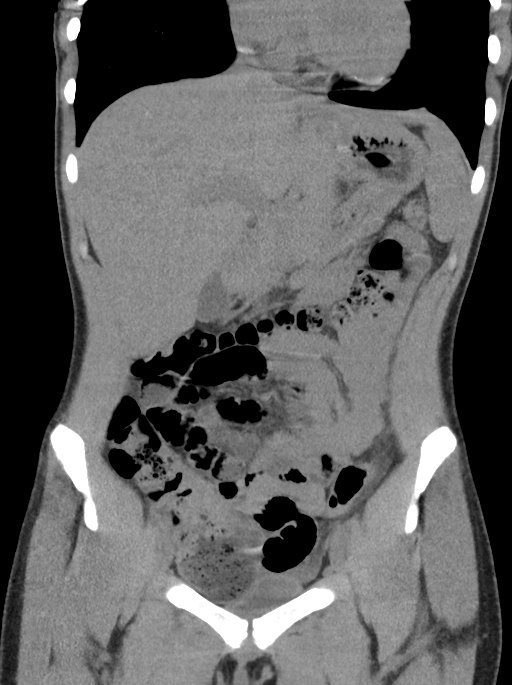
[im 27/61  soft-tissue]
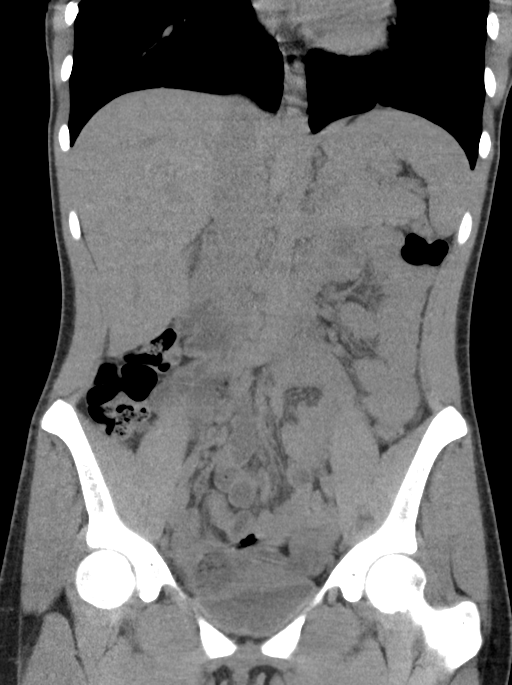
[im 34/61  soft-tissue]
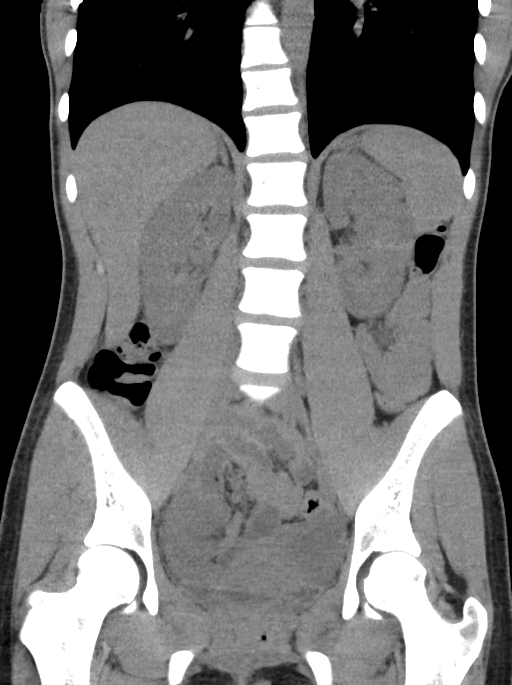

[16 of 46 positions shown; findings below may reference images not displayed]

FINDINGS: Lower chest: No acute abnormality.

Hepatobiliary: No focal liver abnormality. No gallstones,
gallbladder wall thickening, or pericholecystic fluid. No biliary
dilatation.

Pancreas: No focal lesion. Normal pancreatic contour. No surrounding
inflammatory changes. No main pancreatic ductal dilatation.

Spleen: Normal in size without focal abnormality.

Adrenals/Urinary Tract:

No adrenal nodule bilaterally.

No nephrolithiasis, no hydronephrosis, and no contour-deforming
renal mass. No ureterolithiasis or hydroureter.

The urinary bladder is unremarkable.

Stomach/Bowel: Stomach is within normal limits. No evidence of bowel
wall thickening or dilatation. The appendix is visualized but not
well evaluated on this noncontrast study. The appendix appears to be
grossly normal in caliber. The lumen of the appendix is gas filled
with no definite right lower quadrant inflammatory changes (5:
29-46).

Vascular/Lymphatic: No significant vascular findings are present. No
enlarged abdominal or pelvic lymph nodes.

Reproductive: Uterus is unremarkable. Bilateral ovaries appear
prominent in size. Otherwise bilateral adnexa are unremarkable.

Other: Interval development of. No intraperitoneal free gas. No
organized fluid collection.

Musculoskeletal: No acute or significant osseous findings.
IMPRESSION: 1. Prominent size of bilateral ovaries. Recommend pelvic ultrasound
for further evaluation.
2. Trace free pelvic fluid.
3. The appendix is grossly unremarkable.

## 2023-06-03 IMAGING — US US ART/VEN ABD/PELV/SCROTUM DOPPLER LTD
1 series · 13 of 25 positions shown · non-contrast
Comparison: CT abdomen and pelvis 11/24/2020

CLINICAL DATA: Pelvic pain for 5 days, cervical motion tenderness,
RIGHT adnexal tenderness, concern for ovarian torsion, prominent
size of ovaries on CT

EXAM:
TRANSABDOMINAL AND TRANSVAGINAL ULTRASOUND OF PELVIS
DOPPLER ULTRASOUND OF OVARIES
TECHNIQUE: Both transabdominal and transvaginal ultrasound examinations of the
pelvis were performed. Transabdominal technique was performed for
global imaging of the pelvis including uterus, ovaries, adnexal
regions, and pelvic cul-de-sac.
It was necessary to proceed with endovaginal exam following the
transabdominal exam to visualize the ovaries. Color and duplex
Doppler ultrasound was utilized to evaluate blood flow to the
ovaries.

[Series 1: us pelvic doppler (torsion right/o or mass arteria · arterial · 121 acquisitions, 13 frames shown]
[im 1/121]
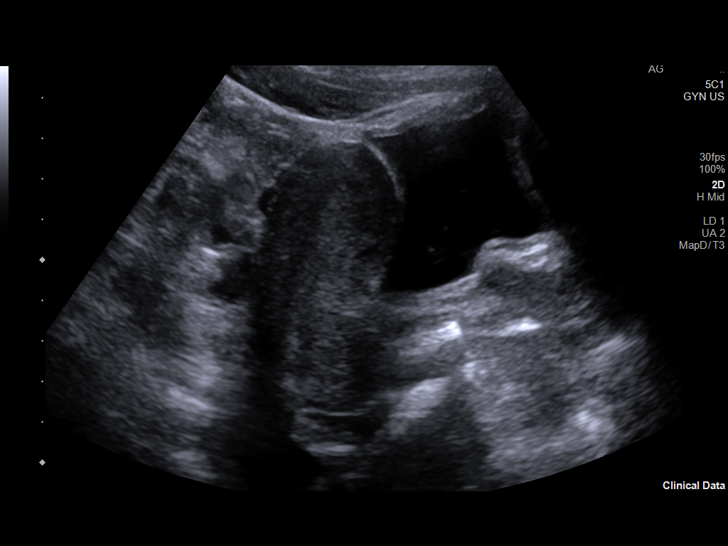
[im 11/121]
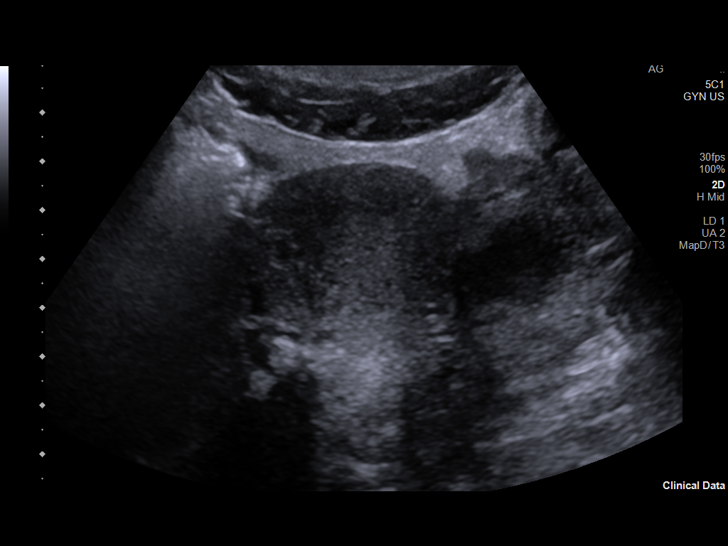
[im 21/121]
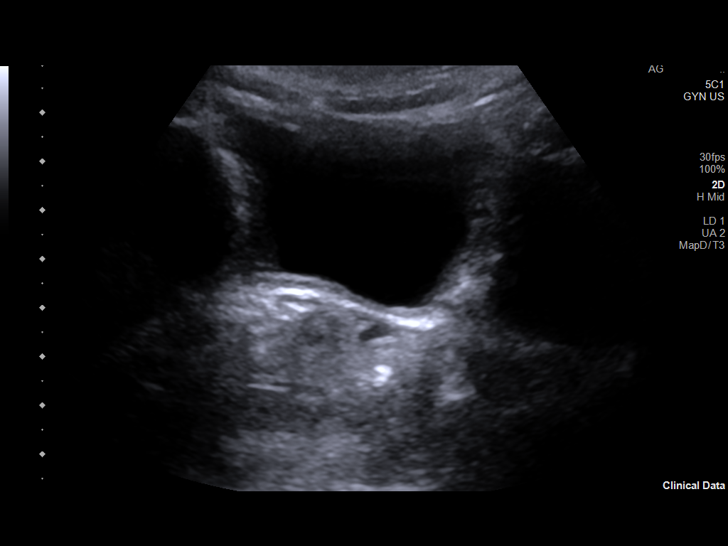
[im 31/121]
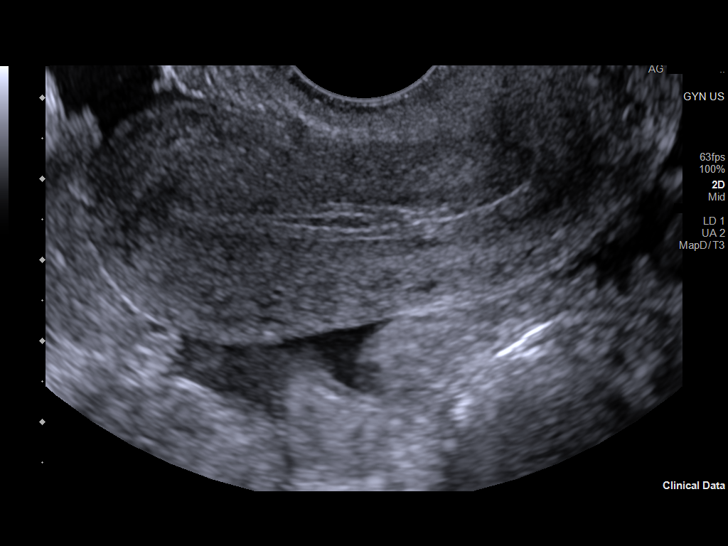
[im 41/121]
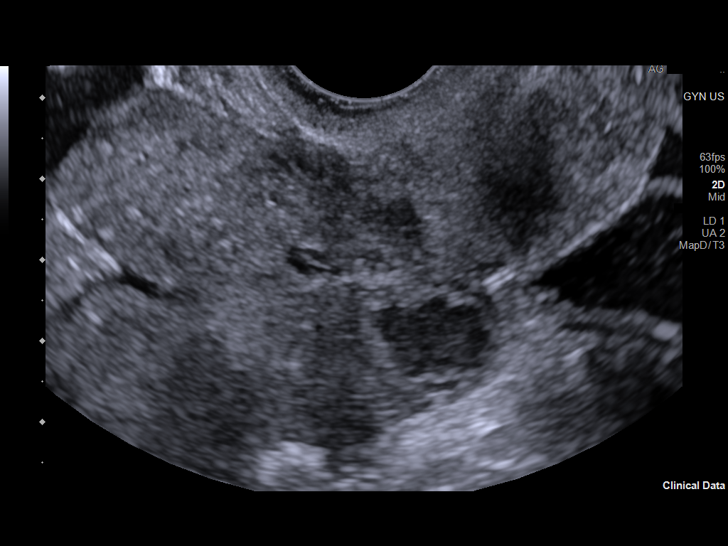
[im 51/121]
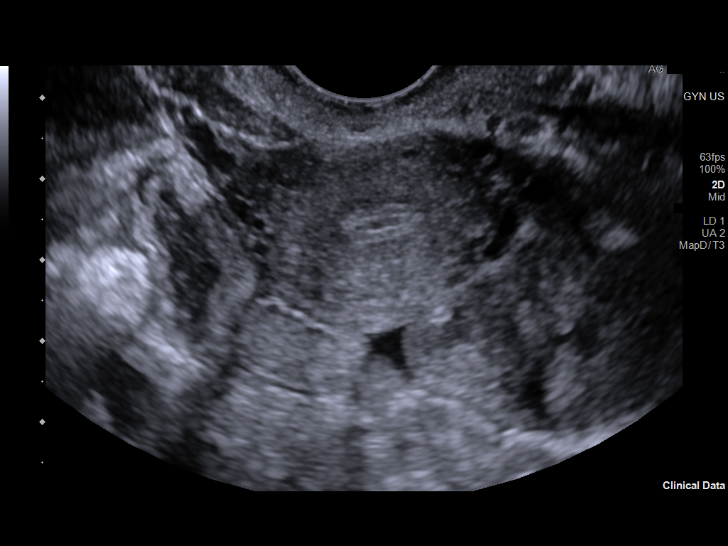
[im 61/121]
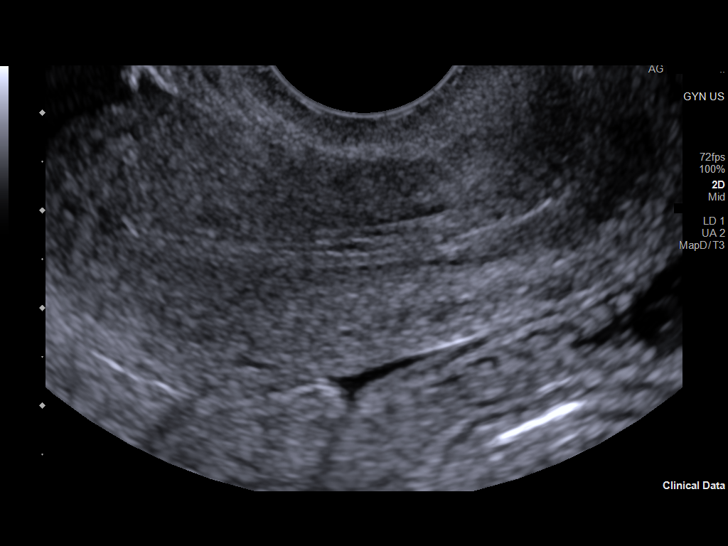
[im 71/121]
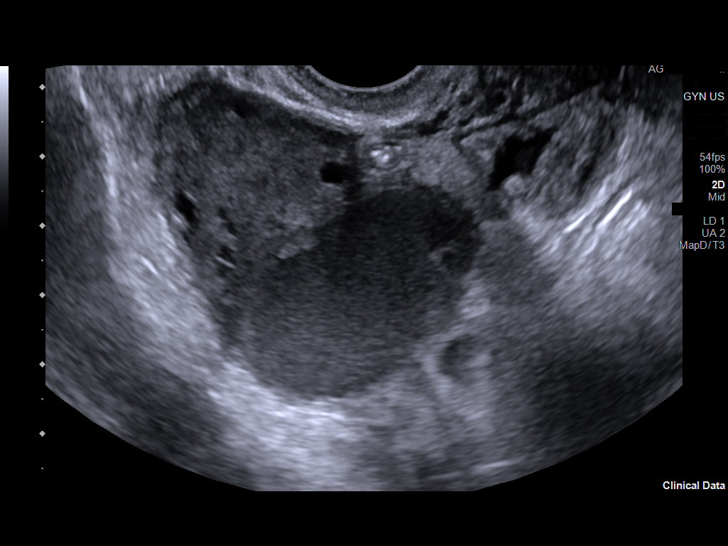
[im 81/121]
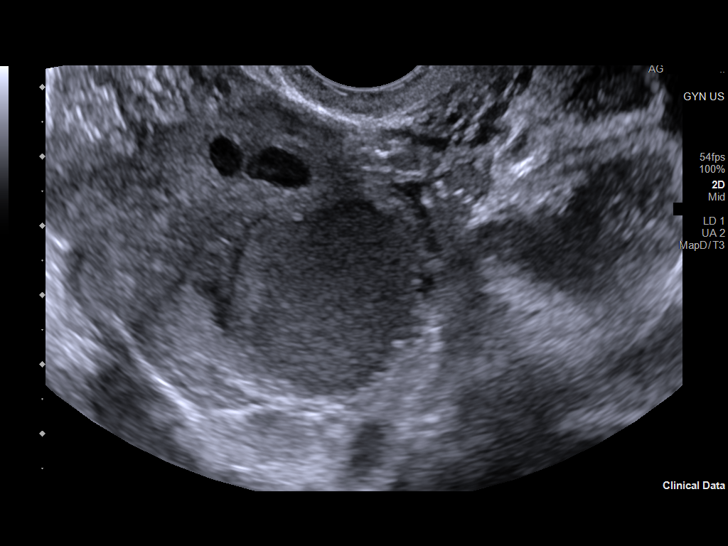
[im 91/121]
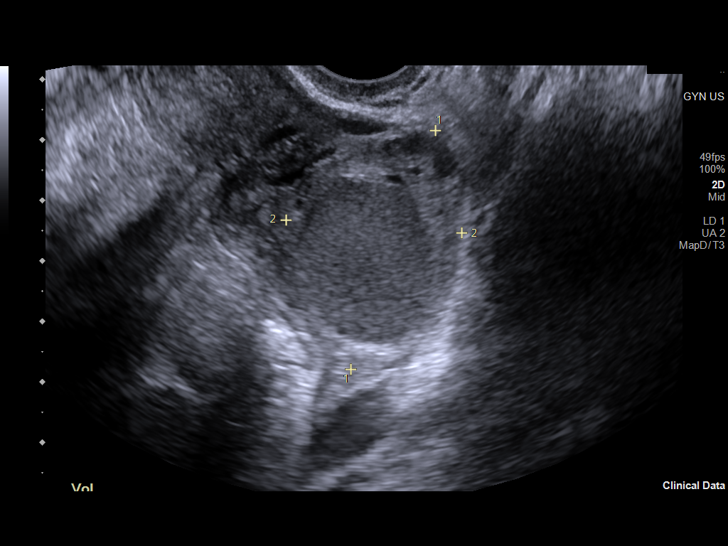
[im 101/121]
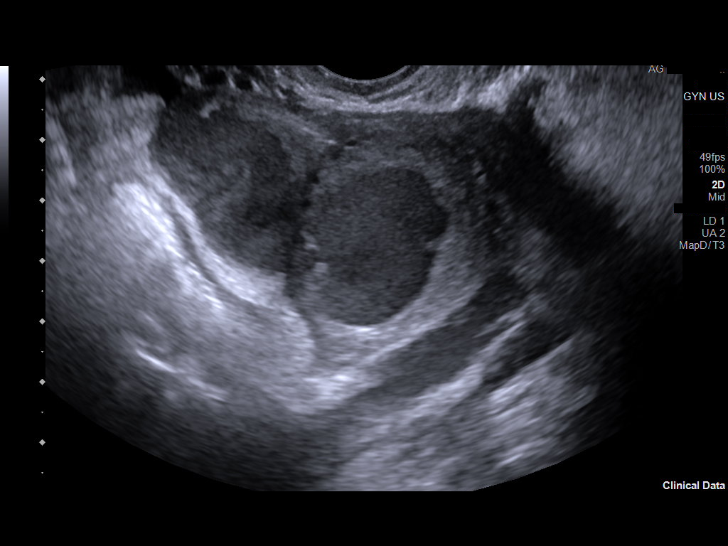
[im 111/121]
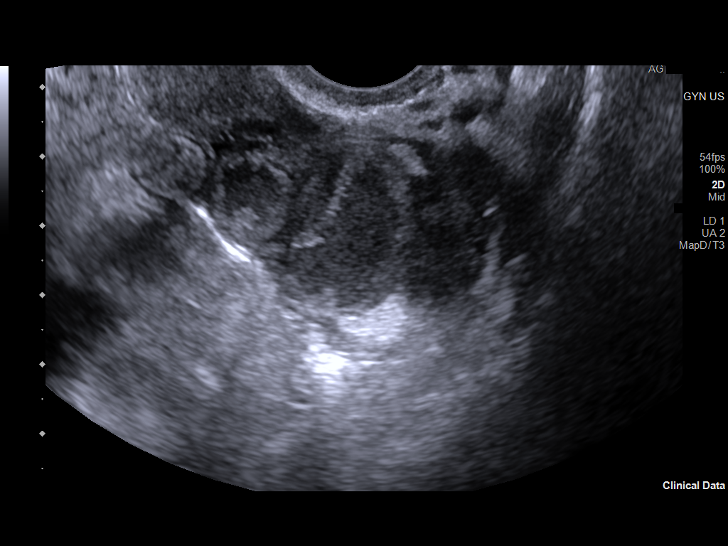
[im 121/121]
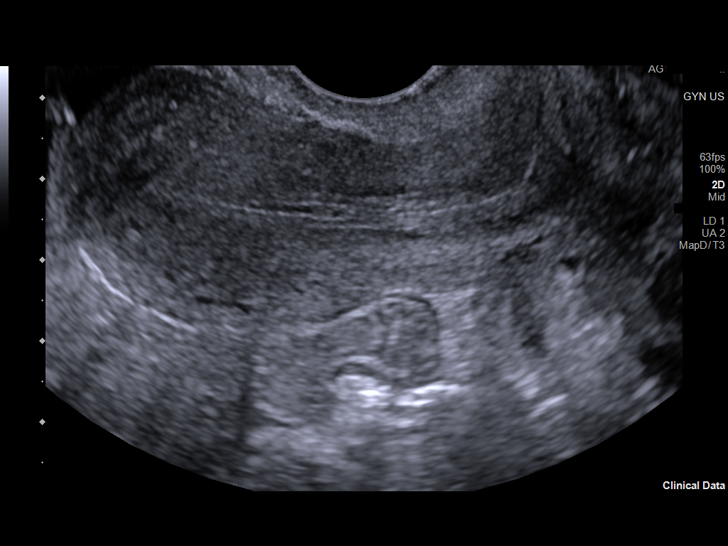

[13 of 25 positions shown; findings below may reference images not displayed]

FINDINGS: Uterus

Measurements: 7.6 x 2.6 x 4.2 cm = volume: 43 mL. Anteverted. Normal
morphology without mass

Endometrium

Thickness: 3 mm. Small amount of fluid/blood within the endometrial
canal. No focal mass.

Right ovary

Measurements: 5.1 x 2.7 x 3.1 cm = volume: 22.5 mL. Complex lesion
with septation versus more likely 2 adjacent lesions within RIGHT
ovary in aggregate 4.4 x 2.4 x 3.0 cm, both locules containing
diffuse low level homogeneous minimally hypoechoic internal
echogenicity, appearance favoring small adjacent endometriomas. No
internal blood flow within these lesions on Doppler assessment.
Blood flow present within LEFT ovary on color Doppler imaging.

Left ovary

Measurements: 4.2 x 2.9 x 3.9 cm = volume: 25.0 mL. Homogeneous
minimally hypoechoic lesion within LEFT ovary 2.9 x 3.0 x 2.6 cm,
lacking internal blood flow on color Doppler imaging, appearance
favoring endometrioma. An additional adjacent more complicated
appearing lesion 3.2 x 2.4 x 3.4 seen, containing curvilinear
increased echogenicity with homogeneous minimally hypoechoic
internal material, potentially representing a coiled dilated
fallopian tube containing blood or debris.

Pulsed Doppler evaluation of both ovaries demonstrates normal
low-resistance arterial and venous waveforms.

Other findings

Trace free pelvic fluid.  No other adnexal masses.
IMPRESSION: BILATERAL hypoechoic lesions within the ovaries as above, appearance
is suggestive of endometriomas.

Additional LEFT adnexal lesion 3.4 cm in greatest size containing a
more curvilinear morphology potentially a coiled dilated fallopian
tube containing blood or debris; this could represent hematosalpinx
or pyosalpinx.

Follow-up ultrasound recommended in 6-12 weeks to reassess.

Trace complicated fluid within endometrial canal.

No evidence of ovarian torsion.

## 2023-07-28 ENCOUNTER — Ambulatory Visit: Payer: Self-pay | Admitting: Nurse Practitioner

## 2023-09-25 ENCOUNTER — Ambulatory Visit
Admission: RE | Admit: 2023-09-25 | Discharge: 2023-09-25 | Disposition: A | Discharge status: Home / Self Care | Payer: Medicaid Other | Source: Ambulatory Visit | Attending: Nurse Practitioner | Admitting: Nurse Practitioner

## 2023-09-25 ENCOUNTER — Other Ambulatory Visit: Payer: Self-pay | Admitting: Nurse Practitioner

## 2023-09-25 DIAGNOSIS — N6310 Unspecified lump in the right breast, unspecified quadrant: Secondary | ICD-10-CM

## 2023-09-26 ENCOUNTER — Telehealth: Payer: Self-pay

## 2023-09-26 NOTE — Telephone Encounter (Signed)
 Copied from CRM [id number]. Topic: Referral - Question >> Sep 25, 2023  3:55 PM Fonda Kinder J wrote: Reason for CRM: [facility] Seen the pt today for a right breast ultrasound and based on the findings they have scheduled the pt to have a biopsy done but they need an order from [facility]. They already have the order in epic and they just need a co sign. Please advise

## 2023-09-26 NOTE — Telephone Encounter (Signed)
Sent to Provider 

## 2023-09-29 ENCOUNTER — Other Ambulatory Visit

## 2023-09-30 ENCOUNTER — Other Ambulatory Visit: Payer: Self-pay | Admitting: Nurse Practitioner

## 2023-09-30 DIAGNOSIS — N6310 Unspecified lump in the right breast, unspecified quadrant: Secondary | ICD-10-CM

## 2023-10-02 ENCOUNTER — Ambulatory Visit
Admission: RE | Admit: 2023-10-02 | Discharge: 2023-10-02 | Disposition: A | Discharge status: Home / Self Care | Source: Ambulatory Visit | Attending: Nurse Practitioner | Admitting: Nurse Practitioner

## 2023-10-02 ENCOUNTER — Ambulatory Visit: Admission: RE | Admit: 2023-10-02 | Source: Ambulatory Visit

## 2023-10-02 DIAGNOSIS — N6310 Unspecified lump in the right breast, unspecified quadrant: Secondary | ICD-10-CM

## 2023-10-03 LAB — SURGICAL PATHOLOGY

## 2023-10-28 ENCOUNTER — Ambulatory Visit
Admission: EM | Admit: 2023-10-28 | Discharge: 2023-10-28 | Disposition: A | Discharge status: Home / Self Care | Attending: Emergency Medicine | Admitting: Emergency Medicine

## 2023-10-28 DIAGNOSIS — H0011 Chalazion right upper eyelid: Secondary | ICD-10-CM

## 2023-10-28 DIAGNOSIS — H00021 Hordeolum internum right upper eyelid: Secondary | ICD-10-CM

## 2023-10-28 MED ORDER — ERYTHROMYCIN 5 MG/GM OP OINT: TOPICAL_OINTMENT | OPHTHALMIC | 0 refills | Status: AC

## 2023-10-28 NOTE — Discharge Instructions (Signed)
 Erythromycin ointment into the lower lid nightly for 5 nights  Warm compress for 10-15 minutes, several times daily.  Avoid using any make-up or products around the eye including eyeliner, mascara, lashes and lash glue, eye shadow, etc. Wash area normally with facial cleanser and water

## 2023-10-28 NOTE — ED Triage Notes (Signed)
 Patient presents to UC for right eye swelling and light sensitivity x 3 days. Has a stye and has used stye eye drops with no relief.   Denies pain.

## 2023-10-28 NOTE — ED Provider Notes (Signed)
 [facility]    CSN: [id number] Arrival date & time: 10/28/23  1101      History   Chief Complaint Chief Complaint  Patient presents with   Eye Problem    HPI Rebecca Roy is a 23 y.o. female.  3-day history of bump on the right upper eyelid. Started after using lash glue. Causing discomfort and some light sensitivity.  Not having pain in the eye or vision changes, no drainage or discharge  Has used OTC stye drops without relief  She does have history of this   Past Medical History:  Diagnosis Date   Gonorrhea     Patient Active Problem List   Diagnosis Date Noted   Mass of lower outer quadrant of right breast 02/24/2023   Elevated platelet count 07/15/2022   Screening for cervical cancer 07/15/2022   Need for immunization against influenza 04/15/2022   Anemia 04/15/2022   Hospital discharge follow-up 04/15/2022   Trichomonal vaginitis 04/15/2022   Marijuana use 04/15/2022   Right tubo-ovarian abscess 03/27/2022   History of PID 12/19/2020   History of gonorrhea 12/19/2020   Contraception management 09/07/2016   Vaginal bleeding 09/07/2016   Induration of skin 09/07/2016   Encounter for initial prescription of injectable contraceptive 11/22/2015   Annual physical exam 08/31/2015    Past Surgical History:  Procedure Laterality Date   BREAST BIOPSY Right 10/02/2023   US  RT BREAST BX W LOC DEV 1ST LESION IMG BX SPEC US  GUIDE 10/02/2023 [facility]    OB History     Gravida  0   Para  0   Term  0   Preterm  0   AB  0   Living  0      SAB  0   IAB  0   Ectopic  0   Multiple  0   Live Births  0            Home Medications    Prior to Admission medications   Medication Sig Start Date End Date Taking? Authorizing Provider  erythromycin ophthalmic ointment Place a 1/2 inch ribbon of ointment into the lower eyelid nightly for 5 nights 10/28/23  Yes Juana Haralson, Ivette Marks, PA-C    Family History Family History  Problem  Relation Age of Onset   Heart disease Mother    Heart disease Father    Heart disease Maternal Aunt    Diabetes Maternal Aunt    Hypertension Maternal Aunt    Heart disease Maternal Grandmother    Diabetes Maternal Grandmother    Hypertension Maternal Grandmother    Other Maternal Grandfather        blood clot in lung   Breast cancer Paternal Grandmother    Cervical cancer Neg Hx     Social History Social History   Tobacco Use   Smoking status: Never    Passive exposure: Yes   Smokeless tobacco: Never  Vaping Use   Vaping status: Every Day   Substances: Nicotine, Flavoring  Substance Use Topics   Alcohol use: No   Drug use: Yes    Types: Marijuana    Comment: everyday     Allergies   Patient has no known allergies.   Review of Systems Review of Systems Per HPI  Physical Exam Triage Vital Signs ED Triage Vitals  Encounter Vitals Group     BP 10/28/23 1115 103/65     Systolic BP Percentile --      Diastolic BP Percentile --  Pulse Rate 10/28/23 1115 66     Resp 10/28/23 1115 16     Temp 10/28/23 1115 97.6 F (36.4 C)     Temp Source 10/28/23 1115 Oral     SpO2 10/28/23 1115 98 %     Weight --      Height --      Head Circumference --      Peak Flow --      Pain Score 10/28/23 1114 0     Pain Loc --      Pain Education --      Exclude from Growth Chart --    No data found.  Updated Vital Signs BP 103/65 (BP Location: Right Arm)   Pulse 66   Temp 97.6 F (36.4 C) (Oral)   Resp 16   LMP 10/23/2023   SpO2 98%   Visual Acuity Right Eye Distance: 20/25 Left Eye Distance: 20/25 Bilateral Distance: 20/20 (without corrective lenses)  Right Eye Near:   Left Eye Near:    Bilateral Near:     Physical Exam Vitals and nursing note reviewed.  Constitutional:      General: She is not in acute distress.    Appearance: She is not ill-appearing.  HENT:     Mouth/Throat:     Mouth: Mucous membranes are moist.     Pharynx: Oropharynx is clear.   Eyes:     General:        Right eye: Hordeolum present. No discharge.     Extraocular Movements: Extraocular movements intact.     Conjunctiva/sclera: Conjunctivae normal.     Pupils: Pupils are equal, round, and reactive to light.     Comments: Chalazion right upper lid. Small hordeolum internum right upper lid. No conjunctival injection or drainage. EOM intact without pain. No periorbital erythema or edema   Cardiovascular:     Rate and Rhythm: Normal rate and regular rhythm.     Heart sounds: Normal heart sounds.  Pulmonary:     Effort: Pulmonary effort is normal.     Breath sounds: Normal breath sounds.  Musculoskeletal:     Cervical back: Normal range of motion.  Skin:    General: Skin is warm and dry.  Neurological:     Mental Status: She is alert and oriented to person, place, and time.     UC Treatments / Results  Labs (all labs ordered are listed, but only abnormal results are displayed) Labs Reviewed - No data to display  EKG  Radiology No results found.  Procedures Procedures (including critical care time)  Medications Ordered in UC Medications - No data to display  Initial Impression / Assessment and Plan / UC Course  I have reviewed the triage vital signs and the nursing notes.  Pertinent labs & imaging results that were available during my care of the patient were reviewed by me and considered in my medical decision making (see chart for details).  Patient has both chalazion and stye right upper lid.  Discussed frequent warm compresses and using erythromycin ointment.  Recommend discontinuing use of all eye products and keeping area clean.  Can return if needed.  Patient agrees to plan, no questions  Final Clinical Impressions(s) / UC Diagnoses   Final diagnoses:  Chalazion of right upper eyelid  Hordeolum internum of right upper eyelid     Discharge Instructions      Erythromycin ointment into the lower lid nightly for 5 nights  Warm compress  for 10-15 minutes, several  times daily.  Avoid using any make-up or products around the eye including eyeliner, mascara, lashes and lash glue, eye shadow, etc. Wash area normally with facial cleanser and water   ED Prescriptions     Medication Sig Dispense Auth. Provider   erythromycin ophthalmic ointment Place a 1/2 inch ribbon of ointment into the lower eyelid nightly for 5 nights 3.5 g Nazaria Riesen, Ivette Marks, PA-C      PDMP not reviewed this encounter.   Jonaya Freshour, Ivette Marks, [place] 10/28/23 1145

## 2023-10-29 ENCOUNTER — Ambulatory Visit: Admitting: Nurse Practitioner

## 2024-03-25 ENCOUNTER — Ambulatory Visit
Admission: EM | Admit: 2024-03-25 | Discharge: 2024-03-25 | Disposition: A | Discharge status: Home / Self Care | Attending: Family Medicine | Admitting: Family Medicine

## 2024-03-25 DIAGNOSIS — Z113 Encounter for screening for infections with a predominantly sexual mode of transmission: Secondary | ICD-10-CM | POA: Diagnosis present

## 2024-03-25 DIAGNOSIS — N898 Other specified noninflammatory disorders of vagina: Secondary | ICD-10-CM | POA: Diagnosis not present

## 2024-03-25 DIAGNOSIS — N3000 Acute cystitis without hematuria: Secondary | ICD-10-CM | POA: Diagnosis not present

## 2024-03-25 LAB — POCT URINE DIPSTICK
Bilirubin, UA: NEGATIVE
Blood, UA: NEGATIVE
Glucose, UA: NEGATIVE mg/dL
Ketones, POC UA: NEGATIVE mg/dL
Nitrite, UA: NEGATIVE
POC PROTEIN,UA: 30 — AB
Spec Grav, UA: 1.02 (ref 1.010–1.025)
Urobilinogen, UA: 0.2 U/dL
pH, UA: 7.5 (ref 5.0–8.0)

## 2024-03-25 LAB — POCT URINE PREGNANCY: Preg Test, Ur: NEGATIVE

## 2024-03-25 MED ORDER — NITROFURANTOIN MONOHYD MACRO 100 MG PO CAPS: 100.0000 mg | ORAL_CAPSULE | Freq: Two times a day (BID) | ORAL | 0 refills | Status: AC

## 2024-03-25 NOTE — ED Triage Notes (Signed)
 Pt present with vaginal discharge x two days. Pt states she is urinating frequently and feels pressure at her lower abdomen. Pt states she wants STD testing. Pt does not want blood work done today.

## 2024-03-25 NOTE — ED Provider Notes (Signed)
 [facility]    CSN: [id number] Arrival date & time: 03/25/24  1247      History   Chief Complaint Chief Complaint  Patient presents with   Vaginal Discharge    HPI Rebecca Roy is a 23 y.o. female presents for vaginal discharge and dysuria.  Patient reports 2 days of a thick non-malodorous nonpruritic vaginal discharge as well as urinary frequency.  Denies burning with urination, urgency, hematuria, fevers, nausea/vomiting, flank pain.  No known STD exposure but she would like screening.  Denies history of BV, yeast, or UTIs.  No OTC treatments have been used for symptoms since onset.  No other concerns at this time.   Vaginal Discharge   Past Medical History:  Diagnosis Date   Gonorrhea     Patient Active Problem List   Diagnosis Date Noted   Mass of lower outer quadrant of right breast 02/24/2023   Elevated platelet count 07/15/2022   Screening for cervical cancer 07/15/2022   Need for immunization against influenza 04/15/2022   Anemia 04/15/2022   Hospital discharge follow-up 04/15/2022   Trichomonal vaginitis 04/15/2022   Marijuana use 04/15/2022   Right tubo-ovarian abscess 03/27/2022   History of PID 12/19/2020   History of gonorrhea 12/19/2020   Contraception management 09/07/2016   Vaginal bleeding 09/07/2016   Induration of skin 09/07/2016   Encounter for initial prescription of injectable contraceptive 11/22/2015   Annual physical exam 08/31/2015    Past Surgical History:  Procedure Laterality Date   BREAST BIOPSY Right 10/02/2023   US  RT BREAST BX W LOC DEV 1ST LESION IMG BX SPEC US  GUIDE 10/02/2023 [facility]    OB History     Gravida  0   Para  0   Term  0   Preterm  0   AB  0   Living  0      SAB  0   IAB  0   Ectopic  0   Multiple  0   Live Births  0            Home Medications    Prior to Admission medications   Medication Sig Start Date End Date Taking? Authorizing Provider  nitrofurantoin,  macrocrystal-monohydrate, (MACROBID) 100 MG capsule Take 1 capsule (100 mg total) by mouth 2 (two) times daily for 7 days. 03/25/24 04/01/24 Yes Loreda Myla SAUNDERS, NP  erythromycin  ophthalmic ointment Place a 1/2 inch ribbon of ointment into the lower eyelid nightly for 5 nights 10/28/23   Rising, [place], PA-C    Family History Family History  Problem Relation Age of Onset   Heart disease Mother    Heart disease Father    Heart disease Maternal Aunt    Diabetes Maternal Aunt    Hypertension Maternal Aunt    Heart disease Maternal Grandmother    Diabetes Maternal Grandmother    Hypertension Maternal Grandmother    Other Maternal Grandfather        blood clot in lung   Breast cancer Paternal Grandmother    Cervical cancer Neg Hx     Social History Social History   Tobacco Use   Smoking status: Never    Passive exposure: Yes   Smokeless tobacco: Never  Vaping Use   Vaping status: Every Day   Substances: Nicotine, Flavoring  Substance Use Topics   Alcohol use: No   Drug use: Yes    Types: Marijuana    Comment: everyday     Allergies   Patient has  no known allergies.   Review of Systems Review of Systems  Genitourinary:  Positive for frequency and vaginal discharge.     Physical Exam Triage Vital Signs ED Triage Vitals [03/25/24 1403]  Encounter Vitals Group     BP (!) 99/58     Girls Systolic BP Percentile      Girls Diastolic BP Percentile      Boys Systolic BP Percentile      Boys Diastolic BP Percentile      Pulse Rate 70     Resp 16     Temp 98.8 F (37.1 C)     Temp src      SpO2 99 %     Weight      Height      Head Circumference      Peak Flow      Pain Score 4     Pain Loc      Pain Education      Exclude from Growth Chart    No data found.  Updated Vital Signs BP (!) 99/58   Pulse 70   Temp 98.8 F (37.1 C)   Resp 16   LMP 02/26/2024 (Exact Date)   SpO2 99%   Visual Acuity Right Eye Distance:   Left Eye Distance:   Bilateral  Distance:    Right Eye Near:   Left Eye Near:    Bilateral Near:     Physical Exam Vitals and nursing note reviewed.  Constitutional:      Appearance: Normal appearance.  HENT:     Head: Normocephalic and atraumatic.  Eyes:     Pupils: Pupils are equal, round, and reactive to light.  Cardiovascular:     Rate and Rhythm: Normal rate.  Pulmonary:     Effort: Pulmonary effort is normal.  Abdominal:     Tenderness: There is no right CVA tenderness or left CVA tenderness.  Skin:    General: Skin is warm and dry.  Neurological:     General: No focal deficit present.     Mental Status: She is alert and oriented to person, place, and time.  Psychiatric:        Mood and Affect: Mood normal.        Behavior: Behavior normal.      UC Treatments / Results  Labs (all labs ordered are listed, but only abnormal results are displayed) Labs Reviewed  POCT URINE DIPSTICK - Abnormal; Notable for the following components:      Result Value   Clarity, UA cloudy (*)    POC PROTEIN,UA =30 (*)    Leukocytes, UA Small (1+) (*)    All other components within normal limits  URINE CULTURE  POCT URINE PREGNANCY  CERVICOVAGINAL ANCILLARY ONLY    EKG   Radiology No results found.  Procedures Procedures (including critical care time)  Medications Ordered in UC Medications - No data to display  Initial Impression / Assessment and Plan / UC Course  I have reviewed the triage vital signs and the nursing notes.  Pertinent labs & imaging results that were available during my care of the patient were reviewed by me and considered in my medical decision making (see chart for details).     Reviewed exam and symptoms with patient.  No red flags.  Will start Macrobid for UTI.  Will send urine culture as well as vaginal swab/STD testing and contact for any positive results.  As she has no history of BV or yeast  will await results prior to initiating any treatment for the vaginal discharge.   Advise rest fluids and PCP follow-up if symptoms do not improve.  ER precautions reviewed. Final Clinical Impressions(s) / UC Diagnoses   Final diagnoses:  Vaginal discharge  Acute cystitis without hematuria  Screening examination for STD (sexually transmitted disease)     Discharge Instructions      The clinical contact you with results of the testing done today are positive.  Start Macrobid twice daily for 7 days for your urinary symptoms.  Lots of rest and fluids.  Please follow-up with your PCP if your symptoms do not improve.  Please go to the ER for any worsening symptoms.  Hope you feel better soon!    ED Prescriptions     Medication Sig Dispense Auth. Provider   nitrofurantoin, macrocrystal-monohydrate, (MACROBID) 100 MG capsule Take 1 capsule (100 mg total) by mouth 2 (two) times daily for 7 days. 14 capsule Mckynzi Cammon, Jodi R, NP      PDMP not reviewed this encounter.   Loreda Myla SAUNDERS, NP 03/25/24 1425

## 2024-03-25 NOTE — Discharge Instructions (Addendum)
 The clinical contact you with results of the testing done today are positive.  Start Macrobid twice daily for 7 days for your urinary symptoms.  Lots of rest and fluids.  Please follow-up with your PCP if your symptoms do not improve.  Please go to the ER for any worsening symptoms.  Hope you feel better soon!

## 2024-03-26 LAB — URINE CULTURE: Culture: NO GROWTH

## 2024-03-26 LAB — CERVICOVAGINAL ANCILLARY ONLY
Bacterial Vaginitis (gardnerella): NEGATIVE
Candida Glabrata: NEGATIVE
Candida Vaginitis: POSITIVE — AB
Chlamydia: NEGATIVE
Comment: NEGATIVE
Comment: NEGATIVE
Comment: NEGATIVE
Comment: NEGATIVE
Comment: NEGATIVE
Comment: NORMAL
Neisseria Gonorrhea: NEGATIVE
Trichomonas: POSITIVE — AB

## 2024-03-29 ENCOUNTER — Ambulatory Visit (HOSPITAL_COMMUNITY): Payer: Self-pay

## 2024-03-29 MED ORDER — METRONIDAZOLE 500 MG PO TABS: 500.0000 mg | ORAL_TABLET | Freq: Two times a day (BID) | ORAL | 0 refills | Status: AC

## 2024-03-29 MED ORDER — FLUCONAZOLE 150 MG PO TABS: 150.0000 mg | ORAL_TABLET | Freq: Once | ORAL | 0 refills | Status: AC

## 2024-05-02 ENCOUNTER — Telehealth: Admitting: Family

## 2024-05-02 DIAGNOSIS — Z3009 Encounter for other general counseling and advice on contraception: Secondary | ICD-10-CM

## 2024-05-02 MED ORDER — NORGESTIMATE-ETH ESTRADIOL 0.25-35 MG-MCG PO TABS: 1.0000 | ORAL_TABLET | Freq: Every day | ORAL | 0 refills | Status: AC

## 2024-05-02 NOTE — Progress Notes (Signed)
 Virtual Visit Consent   Rebecca Roy, you are scheduled for a virtual visit with a [facility] provider today. Just as with appointments in the office, your consent must be obtained to participate. Your consent will be active for this visit and any virtual visit you may have with one of our providers in the next 365 days. If you have a MyChart account, a copy of this consent can be sent to you electronically.  As this is a virtual visit, video technology does not allow for your provider to perform a traditional examination. This may limit your provider's ability to fully assess your condition. If your provider identifies any concerns that need to be evaluated in person or the need to arrange testing (such as labs, EKG, etc.), we will make arrangements to do so. Although advances in technology are sophisticated, we cannot ensure that it will always work on either your end or our end. If the connection with a video visit is poor, the visit may have to be switched to a telephone visit. With either a video or telephone visit, we are not always able to ensure that we have a secure connection.  By engaging in this virtual visit, you consent to the provision of healthcare and authorize for your insurance to be billed (if applicable) for the services provided during this visit. Depending on your insurance coverage, you may receive a charge related to this service.  I need to obtain your verbal consent now. Are you willing to proceed with your visit today? Rebecca Roy has provided verbal consent on 05/02/2024 for a virtual visit (video or telephone). Bari Learn, FNP  Date: 05/02/2024 7:18 PM   Virtual Visit via Video Note   I, Bari Learn, connected with  Rebecca Roy  ([id number], December 29, 2000) on 05/02/24 at  7:30 PM EST by a video-enabled telemedicine application and verified that I am speaking with the correct person using two identifiers.  Location: Patient: Virtual Visit Location Patient:  Home Provider: Virtual Visit Location Provider: Home Office   I discussed the limitations of evaluation and management by telemedicine and the availability of in person appointments. The patient expressed understanding and agreed to proceed.    History of Present Illness: Rebecca Roy is a 23 y.o. who identifies as a female who was assigned female at birth, and is being seen today to discuss OC. She has taken OC in the past and wants to restart this.   Her last menstrual cycle was 04/22/24.  HPI: HPI  Problems:  Patient Active Problem List   Diagnosis Date Noted   Mass of lower outer quadrant of right breast 02/24/2023   Elevated platelet count 07/15/2022   Screening for cervical cancer 07/15/2022   Need for immunization against influenza 04/15/2022   Anemia 04/15/2022   Hospital discharge follow-up 04/15/2022   Trichomonal vaginitis 04/15/2022   Marijuana use 04/15/2022   Right tubo-ovarian abscess 03/27/2022   History of PID 12/19/2020   History of gonorrhea 12/19/2020   Contraception management 09/07/2016   Vaginal bleeding 09/07/2016   Induration of skin 09/07/2016   Encounter for initial prescription of injectable contraceptive 11/22/2015   Annual physical exam 08/31/2015    Allergies: No Known Allergies Medications:  Current Outpatient Medications:    norgestimate -ethinyl estradiol  (SPRINTEC 28) 0.25-35 MG-MCG tablet, Take 1 tablet by mouth daily., Disp: 90 tablet, Rfl: 0   erythromycin  ophthalmic ointment, Place a 1/2 inch ribbon of ointment into the lower eyelid nightly for 5 nights, Disp: 3.5 g, Rfl: 0  Observations/Objective: Patient is well-developed, well-nourished in no acute distress.  Resting comfortably  at home.  Head is normocephalic, atraumatic.  No labored breathing.  Speech is clear and coherent with logical content.  Patient is alert and oriented at baseline.    Assessment and Plan: 1. Birth control counseling (Primary) - norgestimate -ethinyl  estradiol  (SPRINTEC 28) 0.25-35 MG-MCG tablet; Take 1 tablet by mouth daily.  Dispense: 90 tablet; Refill: 0  Safe sex discussed Start Sprintec the Sunday after her menstrual cycle  Will give #90, needs to follow up with PCP for further refills Avoid vaping and smoking   Follow Up Instructions: I discussed the assessment and treatment plan with the patient. The patient was provided an opportunity to ask questions and all were answered. The patient agreed with the plan and demonstrated an understanding of the instructions.  A copy of instructions were sent to the patient via MyChart unless otherwise noted below.     The patient was advised to call back or seek an in-person evaluation if the symptoms worsen or if the condition fails to improve as anticipated.    Bari Learn, FNP

## 2024-07-16 ENCOUNTER — Encounter: Payer: Self-pay | Admitting: Nurse Practitioner

## 2024-07-16 ENCOUNTER — Ambulatory Visit: Admitting: Nurse Practitioner

## 2024-07-16 VITALS — BP 107/62 | HR 60 | Ht 63.0 in | Wt 123.2 lb

## 2024-07-16 DIAGNOSIS — Z130 Encounter for screening for diseases of the blood and blood-forming organs and certain disorders involving the immune mechanism: Secondary | ICD-10-CM

## 2024-07-16 DIAGNOSIS — Z1321 Encounter for screening for nutritional disorder: Secondary | ICD-10-CM

## 2024-07-16 DIAGNOSIS — R1011 Right upper quadrant pain: Secondary | ICD-10-CM

## 2024-07-16 DIAGNOSIS — F419 Anxiety disorder, unspecified: Secondary | ICD-10-CM | POA: Insufficient documentation

## 2024-07-16 DIAGNOSIS — N6313 Unspecified lump in the right breast, lower outer quadrant: Secondary | ICD-10-CM | POA: Diagnosis not present

## 2024-07-16 DIAGNOSIS — F329 Major depressive disorder, single episode, unspecified: Secondary | ICD-10-CM | POA: Insufficient documentation

## 2024-07-16 DIAGNOSIS — Z720 Tobacco use: Secondary | ICD-10-CM | POA: Insufficient documentation

## 2024-07-16 DIAGNOSIS — Z13228 Encounter for screening for other metabolic disorders: Secondary | ICD-10-CM

## 2024-07-16 DIAGNOSIS — Z1329 Encounter for screening for other suspected endocrine disorder: Secondary | ICD-10-CM

## 2024-07-16 DIAGNOSIS — Z113 Encounter for screening for infections with a predominantly sexual mode of transmission: Secondary | ICD-10-CM | POA: Insufficient documentation

## 2024-07-16 DIAGNOSIS — M2351 Chronic instability of knee, right knee: Secondary | ICD-10-CM

## 2024-07-16 DIAGNOSIS — F1290 Cannabis use, unspecified, uncomplicated: Secondary | ICD-10-CM

## 2024-07-16 DIAGNOSIS — Z0000 Encounter for general adult medical examination without abnormal findings: Secondary | ICD-10-CM

## 2024-07-16 DIAGNOSIS — F320 Major depressive disorder, single episode, mild: Secondary | ICD-10-CM

## 2024-07-16 DIAGNOSIS — R109 Unspecified abdominal pain: Secondary | ICD-10-CM | POA: Insufficient documentation

## 2024-07-16 DIAGNOSIS — M2350 Chronic instability of knee, unspecified knee: Secondary | ICD-10-CM | POA: Insufficient documentation

## 2024-07-16 NOTE — Progress Notes (Addendum)
 "  Complete physical exam  Patient: Rebecca Roy   DOB: Jan 07, 2001   24 y.o. Female  MRN: [id number]  Subjective:    Chief Complaint  Patient presents with   Annual Exam      Discussed the use of AI scribe software for clinical note transcription with the patient, who gave verbal consent to proceed.    History of Present Illness Rebecca Roy is a 24 year old female  has a past medical history of Anxiety, Depression, and Gonorrhea.  who presents for an annual physical exam.  She experiences bilateral knee pain, primarily in the right knee, which has been intermittent for the past three years. The pain is associated with the knee 'popping out of place' and then returning to position, causing significant discomfort. This issue began after an incident while playing with friends.  She has had a decreased appetite over the past three months, with episodes of right-sided abdominal pain occurring before meals. The pain typically resolves after resting. She feels hungry but is unable to eat large amounts without feeling nauseous. No constipation, blood in stool, or unintentional weight loss.  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 She follows a general diet but has difficulty maintaining regular eating habits due to her appetite issues. She vapes and occasionally uses marijuana, particularly in social settings. She does not consume alcohol and lives with her mother.  She had a right breast ultrasound in April 2025, which identified a mass. She notes that the mass feels slightly larger since the last examination. No symptoms of discharge or burning.    Assessment & Plan .         Most recent fall risk assessment:    12/19/2020    3:13 PM  Fall Risk   Falls in the past year? 0     Most recent depression screenings:    07/16/2024   10:12 AM 02/24/2023   10:11 AM  PHQ 2/9 Scores  PHQ - 2 Score 1 0  PHQ- 9 Score 9         Patient  Care Team: Kyriaki Moder R, FNP as PCP - General (Nurse Practitioner)   Show/hide medication list[1]  Review of Systems  Constitutional:  Positive for appetite change. Negative for chills, fatigue and fever.  HENT:  Negative for congestion, postnasal drip, rhinorrhea and sneezing.   Eyes:  Negative for pain, discharge and itching.  Respiratory:  Negative for cough, shortness of breath and wheezing.   Cardiovascular:  Negative for chest pain, palpitations and leg swelling.  Gastrointestinal:  Positive for abdominal pain and nausea. Negative for constipation and vomiting.  Endocrine: Negative for cold intolerance, heat intolerance and polydipsia.  Genitourinary:  Negative for difficulty urinating, dysuria, flank pain and frequency.  Musculoskeletal:  Negative for arthralgias, back pain, joint swelling and myalgias.  Skin:  Negative for color change, pallor, rash and wound.  Neurological:  Negative for dizziness, facial asymmetry, weakness, numbness and headaches.  Psychiatric/Behavioral:  Negative for behavioral problems, confusion, self-injury and suicidal ideas.        Objective:     BP 107/62   Pulse 60   Ht 5' 3 (1.6 m)   Wt 123 lb 3.2 oz (55.9 kg)   SpO2 100%   BMI 21.82 kg/m    Physical Exam Vitals and nursing note reviewed. Exam conducted with a chaperone present.  Constitutional:      General: She is not in acute distress.    Appearance:  Normal appearance. She is not ill-appearing, toxic-appearing or diaphoretic.  HENT:     Right Ear: Tympanic membrane, ear canal and external ear normal. There is no impacted cerumen.     Left Ear: Tympanic membrane, ear canal and external ear normal. There is no impacted cerumen.     Nose: Nose normal. No congestion or rhinorrhea.     Mouth/Throat:     Mouth: Mucous membranes are moist.     Pharynx: Oropharynx is clear. No oropharyngeal exudate or posterior oropharyngeal erythema.  Eyes:     General: No scleral icterus.        Right eye: No discharge.        Left eye: No discharge.     Extraocular Movements: Extraocular movements intact.     Conjunctiva/sclera: Conjunctivae normal.  Neck:     Vascular: No carotid bruit.  Cardiovascular:     Rate and Rhythm: Normal rate and regular rhythm.     Pulses: Normal pulses.     Heart sounds: Normal heart sounds. No murmur heard.    No friction rub. No gallop.  Pulmonary:     Effort: Pulmonary effort is normal. No respiratory distress.     Breath sounds: Normal breath sounds. No stridor. No wheezing, rhonchi or rales.  Chest:     Chest wall: No mass, lacerations, deformity, swelling, tenderness, crepitus or edema.  Breasts:    Tanner Score is 5.     Right: Mass present. No swelling, bleeding, inverted nipple, nipple discharge, skin change or tenderness.     Left: Normal. No swelling, bleeding, inverted nipple, mass, nipple discharge, skin change or tenderness.  Abdominal:     General: Bowel sounds are normal. There is no distension.     Palpations: Abdomen is soft. There is no mass.     Tenderness: There is no abdominal tenderness. There is no right CVA tenderness, left CVA tenderness, guarding or rebound.     Hernia: No hernia is present.  Musculoskeletal:        General: No swelling, tenderness, deformity or signs of injury.     Cervical back: Normal range of motion and neck supple. No rigidity or tenderness.     Right lower leg: No edema.     Left lower leg: No edema.  Lymphadenopathy:     Cervical: No cervical adenopathy.     Upper Body:     Right upper body: No supraclavicular, axillary or pectoral adenopathy.     Left upper body: No supraclavicular, axillary or pectoral adenopathy.  Skin:    General: Skin is warm and dry.     Capillary Refill: Capillary refill takes less than 2 seconds.     Coloration: Skin is not jaundiced or pale.     Findings: No bruising, erythema, lesion or rash.  Neurological:     Mental Status: She is alert and oriented to  person, place, and time.     Cranial Nerves: No cranial nerve deficit.     Sensory: No sensory deficit.     Motor: No weakness.     Coordination: Coordination normal.     Gait: Gait normal.     Deep Tendon Reflexes: Reflexes normal.  Psychiatric:        Mood and Affect: Mood normal.        Behavior: Behavior normal.        Thought Content: Thought content normal.        Judgment: Judgment normal.     No results found for  any visits on 07/16/24.     Assessment & Plan:    Routine Health Maintenance and Physical Exam  Immunization History  Administered Date(s) Administered   DTaP 06/30/2000, 09/18/2000, 12/22/2000, 05/05/2002, 05/26/2006   HIB (PRP-OMP) 06/30/2000, 09/18/2000, 12/22/2000, 05/05/2002   HPV 9-valent 08/31/2015, 11/22/2015, 03/06/2016   Hepatitis A, Ped/Adol-2 Dose 05/26/2006, 08/31/2015   Hepatitis B, PED/ADOLESCENT 29-Sep-1999, 06/30/2000, 03/20/2001   Hpv-Unspecified 08/31/2015, 11/22/2015, 03/06/2016   IPV 06/30/2000, 09/18/2000, 05/05/2002, 05/26/2006   Influenza,inj,Quad PF,6+ Mos 08/31/2015, 04/15/2022   Influenza-Unspecified 08/31/2015   MMR 05/05/2002, 05/26/2006   Meningococcal Conjugate 08/31/2015   Pneumococcal-Unspecified 06/30/2000, 09/18/2000   Td 03/10/2012   Varicella 05/05/2002, 05/26/2006    Health Maintenance  Topic Date Due   DTaP/Tdap/Td (7 - Tdap) 03/10/2022   COVID-19 Vaccine (1 - 2025-26 season) Never done   Influenza Vaccine  09/14/2024 (Originally 01/16/2024)   CHLAMYDIA SCREENING  03/25/2025   Cervical Cancer Screening (Pap smear)  07/15/2025   Hepatitis B Vaccines 19-59 Average Risk  Completed   HPV VACCINES  Completed   Hepatitis C Screening  Completed   HIV Screening  Completed   Pneumococcal Vaccine  Aged Out   Meningococcal B Vaccine  Aged Out    Discussed health benefits of physical activity, and encouraged her to engage in regular exercise appropriate for her age and condition.  Problem List Items Addressed This Visit        Other   Annual physical exam - Primary    Routine health maintenance discussed, emphasizing heart-healthy diet, regular exercise, and lifestyle modifications. - Encouraged a heart-healthy, low salt, low fat diet. - Advised regular exercise as tolerated. - Recommended staying well hydrated with at least 64 ounces of water daily. - Advised 7-8 hours of sleep nightly. - Encouraged use of seatbelt and safe storage of firearms Routine fasting labs ordered Due for Tdap vaccine encouraged to get the vaccine at the pharmacy      Marijuana use   Cannabis use Intermittent cannabis use, primarily during social gatherings, potentially linked to nausea and abdominal pain. - Advised cessation of cannabis use to evaluate impact on symptoms.         Mass of lower outer quadrant of right breast   Right breast mass Mass in the lower outer quadrant of the right breast increased in size since April. Biopsy performed on April 18th, 2024. - Facilitated follow-up diagnostic right breast ultrasound in six months.       Anxiety      07/16/2024   10:11 AM 12/19/2020    3:16 PM  GAD 7 : Generalized Anxiety Score  Nervous, Anxious, on Edge 1 0   Control/stop worrying 1 0   Worry too much - different things 1 1   Trouble relaxing 2 0   Restless 0 0   Easily annoyed or irritable 3 1   Afraid - awful might happen 3 0   Total GAD 7 Score 11 2  Anxiety Difficulty Very difficult      Data saved with a previous flowsheet row definition        07/16/2024   10:12 AM 02/24/2023   10:11 AM 07/15/2022    3:11 PM  Depression screen PHQ 2/9  Decreased Interest 1 0 0  Down, Depressed, Hopeless 0 0 0  PHQ - 2 Score 1 0 0  Altered sleeping 0    Tired, decreased energy 2    Change in appetite 3    Feeling bad or failure about yourself  1    Trouble concentrating 2    Moving slowly or fidgety/restless 0    Suicidal thoughts 0    PHQ-9 Score 9    Difficult doing work/chores Very difficult    Due  to life issues in general She denies SI, HI Interested in counseling Referral to therapist placed Follow-up in 3 months      Relevant Orders   AMB Referral [facility] Care Management   Major depression   Due to life issues in general She denies SI, HI Interested in counseling Referral to therapist placed Follow-up in 3 months      Relevant Orders   AMB Referral [facility]I Care Management   Recurrent knee instability   Bilateral knee pain and instability Chronic bilateral knee pain and instability, primarily affecting the right knee with episodes of dislocation causing significant pain for three years. - Referred to orthopedic specialist for further evaluation and management.        Relevant Orders   Ambulatory referral to Orthopedic Surgery   Abdominal pain   Poor appetite and intermittent abdominal pain Pain primarily on the right side before eating, possibly related to hunger and exacerbated by cannabis use. No nausea, vomiting, constipation, or weight loss. - Ordered routine fasting labs including CMP, . - Provided samples of Ensure Nutritional Supplement to support nutritional intake. - Advised cessation of cannabis use to assess impact on symptoms. - Will consider abdominal ultrasound if symptoms persist or worsen.      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 - Encouraged cessation of vaping.       Screen for STD (sexually transmitted disease)   She denies any symptoms but would like to be tested Encouraged to maintain 1 partner and use condoms       Relevant Orders   RPR   HepB+HepC+HIV Panel   Chlamydia/Gonococcus/Trichomonas, NAA   Other Visit Diagnoses       Screening for endocrine, nutritional, metabolic and immunity disorder       Relevant Orders   CBC   CMP14+EGFR   Lipid panel      Return in about 3 months (around 10/14/2024) for ANXIETY, DEPRESSION.     Tasha Diaz R Emmons Toth, FNP      [1]  Outpatient Medications Prior to Visit  Medication Sig   erythromycin  ophthalmic ointment Place a 1/2 inch ribbon of ointment into the lower eyelid nightly for 5 nights (Patient not taking: Reported on 07/16/2024)   norgestimate -ethinyl estradiol  (SPRINTEC 28) 0.25-35 MG-MCG tablet Take 1 tablet by mouth daily. (Patient not taking: Reported on 07/16/2024)   No facility-administered medications prior to visit.   "

## 2024-07-16 NOTE — Assessment & Plan Note (Signed)
 Due to life issues in general She denies SI, HI Interested in counseling Referral to therapist placed Follow-up in 3 months

## 2024-07-16 NOTE — Assessment & Plan Note (Addendum)
 She denies any symptoms but would like to be tested Encouraged to maintain 1 partner and use condoms

## 2024-07-16 NOTE — Assessment & Plan Note (Signed)
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 - Encouraged cessation of vaping.  "

## 2024-07-16 NOTE — Assessment & Plan Note (Signed)
 Poor appetite and intermittent abdominal pain Pain primarily on the right side before eating, possibly related to hunger and exacerbated by cannabis use. No nausea, vomiting, constipation, or weight loss. - Ordered routine fasting labs including CMP, . - Provided samples of Ensure Nutritional Supplement to support nutritional intake. - Advised cessation of cannabis use to assess impact on symptoms. - Will consider abdominal ultrasound if symptoms persist or worsen.

## 2024-07-16 NOTE — Assessment & Plan Note (Addendum)
" °  Routine health maintenance discussed, emphasizing heart-healthy diet, regular exercise, and lifestyle modifications. - Encouraged a heart-healthy, low salt, low fat diet. - Advised regular exercise as tolerated. - Recommended staying well hydrated with at least 64 ounces of water daily. - Advised 7-8 hours of sleep nightly. - Encouraged use of seatbelt and safe storage of firearms Routine fasting labs ordered Due for Tdap vaccine encouraged to get the vaccine at the pharmacy "

## 2024-07-16 NOTE — Assessment & Plan Note (Signed)
 Right breast mass Mass in the lower outer quadrant of the right breast increased in size since April. Biopsy performed on April 18th, 2024. - Facilitated follow-up diagnostic right breast ultrasound in six months.

## 2024-07-16 NOTE — Assessment & Plan Note (Addendum)
" °    07/16/2024   10:11 AM 12/19/2020    3:16 PM  GAD 7 : Generalized Anxiety Score  Nervous, Anxious, on Edge 1 0   Control/stop worrying 1 0   Worry too much - different things 1 1   Trouble relaxing 2 0   Restless 0 0   Easily annoyed or irritable 3 1   Afraid - awful might happen 3 0   Total GAD 7 Score 11 2  Anxiety Difficulty Very difficult      Data saved with a previous flowsheet row definition        07/16/2024   10:12 AM 02/24/2023   10:11 AM 07/15/2022    3:11 PM  Depression screen PHQ 2/9  Decreased Interest 1 0 0  Down, Depressed, Hopeless 0 0 0  PHQ - 2 Score 1 0 0  Altered sleeping 0    Tired, decreased energy 2    Change in appetite 3    Feeling bad or failure about yourself  1    Trouble concentrating 2    Moving slowly or fidgety/restless 0    Suicidal thoughts 0    PHQ-9 Score 9    Difficult doing work/chores Very difficult    Due to life issues in general She denies SI, HI Interested in counseling Referral to therapist placed Follow-up in 3 months "

## 2024-07-16 NOTE — Assessment & Plan Note (Addendum)
 Cannabis use Intermittent cannabis use, primarily during social gatherings, potentially linked to nausea and abdominal pain. - Advised cessation of cannabis use to evaluate impact on symptoms.

## 2024-07-16 NOTE — Assessment & Plan Note (Signed)
 Bilateral knee pain and instability Chronic bilateral knee pain and instability, primarily affecting the right knee with episodes of dislocation causing significant pain for three years. - Referred to orthopedic specialist for further evaluation and management.

## 2024-07-17 LAB — LIPID PANEL
Chol/HDL Ratio: 2.6 ratio (ref 0.0–4.4)
Cholesterol, Total: 146 mg/dL (ref 100–199)
HDL: 56 mg/dL
LDL Chol Calc (NIH): 82 mg/dL (ref 0–99)
Triglycerides: 34 mg/dL (ref 0–149)
VLDL Cholesterol Cal: 8 mg/dL (ref 5–40)

## 2024-07-17 LAB — CBC
Hematocrit: 42.1 % (ref 34.0–46.6)
Hemoglobin: 13.1 g/dL (ref 11.1–15.9)
MCH: 29.4 pg (ref 26.6–33.0)
MCHC: 31.1 g/dL — ABNORMAL LOW (ref 31.5–35.7)
MCV: 94 fL (ref 79–97)
Platelets: 355 10*3/uL (ref 150–450)
RBC: 4.46 x10E6/uL (ref 3.77–5.28)
RDW: 12.8 % (ref 11.7–15.4)
WBC: 4.7 10*3/uL (ref 3.4–10.8)

## 2024-07-17 LAB — HEPB+HEPC+HIV PANEL
HIV Screen 4th Generation wRfx: NONREACTIVE
Hep B C IgM: NEGATIVE
Hep B Core Total Ab: NEGATIVE
Hep B E Ab: NONREACTIVE
Hep B E Ag: NEGATIVE
Hep B Surface Ab, Qual: NONREACTIVE
Hep C Virus Ab: NONREACTIVE
Hepatitis B Surface Ag: NEGATIVE

## 2024-07-17 LAB — CMP14+EGFR
ALT: 15 [IU]/L (ref 0–32)
AST: 19 [IU]/L (ref 0–40)
Albumin: 4.8 g/dL (ref 4.0–5.0)
Alkaline Phosphatase: 66 [IU]/L (ref 41–116)
BUN/Creatinine Ratio: 13 (ref 9–23)
BUN: 11 mg/dL (ref 6–20)
Bilirubin Total: 1 mg/dL (ref 0.0–1.2)
CO2: 22 mmol/L (ref 20–29)
Calcium: 9.3 mg/dL (ref 8.7–10.2)
Chloride: 106 mmol/L (ref 96–106)
Creatinine, Ser: 0.84 mg/dL (ref 0.57–1.00)
Globulin, Total: 2.6 g/dL (ref 1.5–4.5)
Glucose: 84 mg/dL (ref 70–99)
Potassium: 4.2 mmol/L (ref 3.5–5.2)
Sodium: 141 mmol/L (ref 134–144)
Total Protein: 7.4 g/dL (ref 6.0–8.5)
eGFR: 99 mL/min/{1.73_m2}

## 2024-07-17 LAB — SYPHILIS: RPR W/REFLEX TO RPR TITER AND TREPONEMAL ANTIBODIES, TRADITIONAL SCREENING AND DIAGNOSIS ALGORITHM: RPR Ser Ql: NONREACTIVE

## 2024-07-19 ENCOUNTER — Telehealth: Payer: Self-pay

## 2024-07-19 ENCOUNTER — Ambulatory Visit: Payer: Self-pay | Admitting: Nurse Practitioner

## 2024-07-19 DIAGNOSIS — A5901 Trichomonal vulvovaginitis: Secondary | ICD-10-CM

## 2024-07-20 LAB — CHLAMYDIA/GONOCOCCUS/TRICHOMONAS, NAA
Chlamydia by NAA: NEGATIVE
Gonococcus by NAA: NEGATIVE
Trich vag by NAA: POSITIVE — AB

## 2024-07-20 MED ORDER — METRONIDAZOLE 500 MG PO TABS: 500.0000 mg | ORAL_TABLET | Freq: Two times a day (BID) | ORAL | 0 refills | Status: AC

## 2024-07-21 NOTE — Progress Notes (Unsigned)
 Complex Care Management Note Care Guide Note  07/21/2024 Name: Rebecca Roy MRN: [id number] DOB: 11-15-2000   Complex Care Management Outreach Attempts: A second unsuccessful outreach was attempted today to offer the patient with information about available complex care management services.  Follow Up Plan:  Additional outreach attempts will be made to offer the patient complex care management information and services.   Encounter Outcome:  No Answer  Dreama [facility]  [facility], [facility] VBCI Assistant Direct Dial: [phone]  Fax: [phone]

## 2024-07-23 NOTE — Progress Notes (Signed)
 Complex Care Management Note Care Guide Note  07/23/2024 Name: Rebecca Roy MRN: [id number] DOB: Jul 29, 2000   Complex Care Management Outreach Attempts: A third unsuccessful outreach was attempted today to offer the patient with information about available complex care management services.  Follow Up Plan:  No further outreach attempts will be made at this time. We have been unable to contact the patient to offer or enroll patient in complex care management services.  Encounter Outcome:  No Answer  Dreama [facility]wood [facility]  [facility], [facility] VBCI Assistant Direct Dial: [phone]  Fax: [phone]

## 2024-10-11 ENCOUNTER — Encounter: Admitting: Nurse Practitioner

## 2024-10-15 ENCOUNTER — Ambulatory Visit: Payer: Self-pay | Admitting: Nurse Practitioner
